# Patient Record
Sex: Female | Born: 1935 | Race: White | Hispanic: No | Marital: Married | Smoking: Current every day smoker
Health system: Southern US, Community
[De-identification: ages and names within clinical notes are randomized; demographics above are authoritative.]

## PROBLEM LIST (undated history)

## (undated) DIAGNOSIS — I714 Abdominal aortic aneurysm, without rupture, unspecified: Secondary | ICD-10-CM

## (undated) DIAGNOSIS — I701 Atherosclerosis of renal artery: Secondary | ICD-10-CM

## (undated) DIAGNOSIS — I10 Essential (primary) hypertension: Secondary | ICD-10-CM

## (undated) DIAGNOSIS — M1990 Unspecified osteoarthritis, unspecified site: Secondary | ICD-10-CM

## (undated) DIAGNOSIS — E785 Hyperlipidemia, unspecified: Secondary | ICD-10-CM

## (undated) DIAGNOSIS — I77810 Thoracic aortic ectasia: Secondary | ICD-10-CM

## (undated) DIAGNOSIS — J449 Chronic obstructive pulmonary disease, unspecified: Secondary | ICD-10-CM

## (undated) HISTORY — DX: Chronic obstructive pulmonary disease, unspecified: J44.9

## (undated) HISTORY — DX: Abdominal aortic aneurysm, without rupture, unspecified: I71.40

## (undated) HISTORY — DX: Unspecified osteoarthritis, unspecified site: M19.90

## (undated) HISTORY — DX: Abdominal aortic aneurysm, without rupture: I71.4

## (undated) HISTORY — DX: Thoracic aortic ectasia: I77.810

## (undated) HISTORY — PX: ABDOMINAL HYSTERECTOMY: SHX81

## (undated) HISTORY — DX: Hyperlipidemia, unspecified: E78.5

## (undated) HISTORY — PX: KIDNEY SURGERY: SHX687

## (undated) HISTORY — DX: Atherosclerosis of renal artery: I70.1

## (undated) HISTORY — DX: Essential (primary) hypertension: I10

---

## 2005-08-20 HISTORY — PX: ABDOMINAL AORTIC ANEURYSM REPAIR: SUR1152

## 2005-12-07 ENCOUNTER — Ambulatory Visit (HOSPITAL_COMMUNITY): Admission: RE | Admit: 2005-12-07 | Discharge: 2005-12-07 | Payer: Self-pay | Admitting: *Deleted

## 2006-01-07 ENCOUNTER — Encounter (INDEPENDENT_AMBULATORY_CARE_PROVIDER_SITE_OTHER): Payer: Self-pay | Admitting: Specialist

## 2006-01-07 ENCOUNTER — Inpatient Hospital Stay (HOSPITAL_COMMUNITY): Admission: RE | Admit: 2006-01-07 | Discharge: 2006-01-12 | Payer: Self-pay | Admitting: *Deleted

## 2006-10-24 ENCOUNTER — Ambulatory Visit: Payer: Self-pay | Admitting: *Deleted

## 2006-10-24 ENCOUNTER — Encounter: Admission: RE | Admit: 2006-10-24 | Discharge: 2006-10-24 | Payer: Self-pay | Admitting: *Deleted

## 2007-10-30 ENCOUNTER — Ambulatory Visit: Payer: Self-pay | Admitting: *Deleted

## 2007-10-30 ENCOUNTER — Encounter: Admission: RE | Admit: 2007-10-30 | Discharge: 2007-10-30 | Payer: Self-pay | Admitting: *Deleted

## 2008-10-21 ENCOUNTER — Encounter: Admission: RE | Admit: 2008-10-21 | Discharge: 2008-10-21 | Payer: Self-pay | Admitting: *Deleted

## 2008-10-21 ENCOUNTER — Ambulatory Visit: Payer: Self-pay | Admitting: *Deleted

## 2009-11-04 ENCOUNTER — Encounter: Admission: RE | Admit: 2009-11-04 | Discharge: 2009-11-04 | Payer: Self-pay | Admitting: Vascular Surgery

## 2009-11-04 ENCOUNTER — Ambulatory Visit: Payer: Self-pay | Admitting: Vascular Surgery

## 2010-01-10 ENCOUNTER — Ambulatory Visit: Payer: Self-pay | Admitting: Surgery

## 2010-01-10 ENCOUNTER — Ambulatory Visit (HOSPITAL_COMMUNITY): Admission: RE | Admit: 2010-01-10 | Discharge: 2010-01-10 | Payer: Self-pay | Admitting: Surgery

## 2010-01-10 HISTORY — PX: RENAL ARTERY STENT: SHX2321

## 2010-09-12 ENCOUNTER — Ambulatory Visit: Admit: 2010-09-12 | Payer: Self-pay | Admitting: Vascular Surgery

## 2010-09-12 ENCOUNTER — Ambulatory Visit
Admission: RE | Admit: 2010-09-12 | Discharge: 2010-09-12 | Payer: Self-pay | Source: Home / Self Care | Attending: Vascular Surgery | Admitting: Vascular Surgery

## 2010-09-13 NOTE — Assessment & Plan Note (Signed)
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  She also has a known thoracic ectasia and is status post resection and graft of abdominal aortic aneurysm. She reports that her renal function remains stable.  She did undergo a right renal artery angioplasty and stenting on Jan 10, 2010 for critical stenosis in the small kidney.  She reports no knowledge of any worsening renal function and had recent laboratory studies.  Her health is otherwise unchanged.  PHYSICAL EXAMINATION:  Blood pressure 127/69, pulse 71, respirations 18. She is no acute distress. HEENT:  Normal. CHEST:  Clear bilaterally. HEART:  Regular rate and rhythm. ABDOMEN:  Soft and nontender.  I do not hear any bruits. Her radial pulses are 2+. MUSCULOSKELETAL:  Shows no major deformity or cyanosis. NEUROLOGIC:  No focal weakness or paresthesias. SKIN:  Without ulcers or rashes.  She did undergo repeat renal duplex today and this shows continued shrinking in the size of her right kidney artery down to 6.5 cm.  The left kidney is 12.0 cm.  Her renal artery shows no evidence of restenosis.  I have recommend continued follow-up only with Tina Macdonald.  She does have a known 4.5 cm thoracic aneurysm and we will repeat her CT scan in 1 year and at that time reevaluate her renal artery as well.    Larina Earthly, M.D. Electronically Signed  TFE/MEDQ  D:  09/12/2010  T:  09/13/2010  Job:  [id number]

## 2010-09-22 NOTE — Procedures (Unsigned)
RENAL ARTERY DUPLEX EVALUATION  INDICATION:  Followup renal artery stent.  HISTORY: Diabetes:  No Cardiac:  No Hypertension:  Yes Smoking:  No  RENAL ARTERY DUPLEX FINDINGS: Aorta-Proximal:  39 cm/s Aorta-Mid:  37 cm/s Aorta-Distal:  30 cm/s Celiac Artery Origin:  136 cm/s SMA Origin:  42 cm/s                                   RIGHT               LEFT Renal Artery Origin:             NV cm/s             NV cm/s Renal Artery Proximal:           NV cm/s             NV cm/s Renal Artery Mid:                65 cm/s             92 cm/s Renal Artery Distal:             38 cm/s             113 cm/s Hilar Acceleration Time (AT):      m/s2                m/s2 Renal-Aortic Ratio (RAR):        1.67                2.9 Kidney Size:                     6.51 cm             12.0 cm End Diastolic Ratio (EDR): Resistive Index (RI):            0.73                0.65  IMPRESSION: 1. Right kidney appears atrophied; however, demonstrates normal     perfusion. 2. Left kidney appears within normal limits in regard to size. 3. No evidence of renal artery stenosis in visualized areas. 4. RI and RAR appear within normal limits bilaterally. 5. Limited visualization due to extensive bowel gas shadowing,     possibly due to patient cough.  ___________________________________________ Larina Earthly, M.D.  AS/MEDQ  D:  09/12/2010  T:  09/12/2010  Job:  [id number]

## 2010-10-04 ENCOUNTER — Other Ambulatory Visit: Payer: Self-pay | Admitting: Vascular Surgery

## 2010-10-04 DIAGNOSIS — R978 Other abnormal tumor markers: Secondary | ICD-10-CM

## 2010-10-31 ENCOUNTER — Ambulatory Visit
Admission: RE | Admit: 2010-10-31 | Discharge: 2010-10-31 | Disposition: A | Discharge status: Home / Self Care | Payer: Medicare Other | Source: Ambulatory Visit | Attending: Vascular Surgery | Admitting: Vascular Surgery

## 2010-10-31 ENCOUNTER — Other Ambulatory Visit: Payer: Self-pay

## 2010-10-31 ENCOUNTER — Ambulatory Visit (INDEPENDENT_AMBULATORY_CARE_PROVIDER_SITE_OTHER): Payer: Medicare Other | Admitting: Vascular Surgery

## 2010-10-31 DIAGNOSIS — I712 Thoracic aortic aneurysm, without rupture: Secondary | ICD-10-CM

## 2010-10-31 DIAGNOSIS — R978 Other abnormal tumor markers: Secondary | ICD-10-CM

## 2010-10-31 MED ORDER — IOHEXOL 350 MG/ML SOLN
100.0000 mL | Freq: Once | INTRAVENOUS | Status: AC | PRN
  Administered 2010-10-31: 100 mL via INTRAVENOUS

## 2010-10-31 NOTE — Assessment & Plan Note (Signed)
OFFICE VISIT  AHMANI, DAOUD DOB:  19-Jul-1928                                       10/31/2010 [id number]#:[id number]  Patient presents today for follow-up of her thoracic ectasia.  She is status post abdominal aortic aneurysm repair by Dr. Liliane Bade in 2007 with an open aneurysm repair.  At that time, her aneurysm was 5.3 cm in diameter.  She does have a known 3.5 to 4 cm ectasia throughout her thoracic aorta.  She had the last CT scan 1 year and is here today for follow-up of this.  She has done quite well recently with no new medical problems and walks without difficulty.  PHYSICAL EXAMINATION:  Blood pressure is 112/71, pulse 65, respiration 20.  HEENT:  Normal.  She is in no acute distress.  Her chest is clear bilaterally without rales, rhonchi or wheezes.  Her abdomen has normal pulsation, a well-healed midline incision, and normal 2+ femoral pulses bilaterally.  I reviewed her CT scan and discussed this patient.  This shows no change from her study 1 year ago.  I feel comfortable dropping back to every- other-year CT scan.  We will see her 1 year from now for renal duplex for follow-up of her renal angioplasty.    Larina Earthly, M.D. Electronically Signed  TFE/MEDQ  D:  10/31/2010  T:  10/31/2010  Job:  [id number]  cc:   Raynelle Jan, M.D.

## 2010-11-06 LAB — POCT I-STAT, CHEM 8
BUN: 15 mg/dL (ref 6–23)
Calcium, Ion: 1.06 mmol/L — ABNORMAL LOW (ref 1.12–1.32)
Chloride: 99 mEq/L (ref 96–112)
Creatinine, Ser: 0.9 mg/dL (ref 0.4–1.2)
Glucose, Bld: 89 mg/dL (ref 70–99)
Potassium: 4.4 mEq/L (ref 3.5–5.1)
Sodium: 134 mEq/L — ABNORMAL LOW (ref 135–145)

## 2011-01-02 NOTE — Assessment & Plan Note (Signed)
OFFICE VISIT   Tina Macdonald, Tina Macdonald  DOB:  04-08-1928                                       11/04/2009  ZOXWR#:[id number]   Patient presents today for continued follow-up of her known thoracic  aneurysm.  She is a former patient Dr. Liliane Bade and understands that  he has left our practice.  His most recent with her was 1 year ago.  At  that time, she had had a CT scan showing no change in her ectasia of her  thoracic aneurysm.  She is seen today for repeat CT scan at 1-year  interval.   She continues to be quite active.  She does have no major medical  difficulties currently.  She is stable from a cardiac standpoint.  Her  blood pressure and elevated cholesterol are controlled with medications.  She does not smoke, having quit in 1995.  Does not drink alcohol.   Her review of systems is reviewed and unchanged with her.   PHYSICAL EXAMINATION:  A well-developed, thin white female appearing her  stated age of 37.  Her radial pulses and femoral pulses and popliteal  pulses are 2+ bilaterally.  Heart:  Regular rate rhythm.  Chest is clear  bilaterally.  Abdominal exam reveals no masses or tenderness.  She has a  well-healed prior incision.  Blood pressure today is 133/78, heart rate  69, respirations 16.   She did undergo a CT scan of her chest and abdomen, and this reveals no  change in the ectasia of her thoracic aorta and no evidence of  difficulty with her prior open aneurysm repair.  She does have severe  right renal artery stenosis by CT angiogram.   On reviewing her old records, this had been considered prior to her  juxtarenal aneurysm repair by Dr. Madilyn Fireman.  Apparently, he felt  intraoperatively that there was no significant concern with this and did  not address this.  Her CT angiogram clearly shows a severe stenosis, and  she does have a smaller renal size.   I explained to the patient with her husband present that I would  recommend a formal  arteriography and probable stenting since she is  having some cortical thinning and a smaller size in her right versus  left renal artery.  She understands the indication of this and will be  scheduled as an outpatient at [facility] with Dr. Hermelinda Medicus, M.D.  Electronically Signed   TFE/MEDQ  D:  11/04/2009  T:  11/07/2009  Job:  [id number]   cc:   Raynelle Jan, M.D.  Jorge Ny, MD

## 2011-01-02 NOTE — Assessment & Plan Note (Signed)
OFFICE VISIT   Tina Macdonald, Tina Macdonald  DOB:  05/19/1928                                       10/30/2007  AOZHY#:[id number]   Patient has a history of an abdominal aortic aneurysm with juxtarenal  aneurysm repair carried out in 05/07.  She has a known small atrophic  right kidney.  She has also developed some ectasia of the descending  thoracic aorta and thoracoabdominal junction.   A CT scan was performed today, and initial measurements carried out by  me reveal no significant change in her descending thoracic aorta  measuring approximately 3.5 cm in maximal diameter and a similar  measurement in the suprarenal abdominal aorta.  This does not appear to  have changed significantly over the past year.   Patient does complain of some chronic back pain.  She has had no chest  pain or shortness of breath.   CURRENT MEDICATIONS:  1. Toprol XL 50 mg daily.  2. Vytorin 10/40 daily.  3. Hydrochlorothiazide 25 mg daily.   She has no known drug allergies.   Evaluation reveals the patient to be a 77 year old female who appears  her stated age.  She is alert and oriented in no acute distress.  Vitals  reveal a BP of 135/76, pulse 65 per minute, respirations 18 per minute,  O2 saturation 96%.  No carotid bruits are present.  Heart sounds are  normal without murmurs.  Chest:  Clear with equal entry bilaterally.  Abdomen is soft and nontender.  A well-healed midline incision.  No  masses or organomegaly.  Femoral pulses are 2+ bilaterally.  No ankle  edema.   Patient shows no significant change of her ectatic thoracic and  suprarenal aorta.  I will plan to follow up again in one year with a  repeat CT scan.   Balinda Quails, M.D.  Electronically Signed   PGH/MEDQ  D:  10/30/2007  T:  10/31/2007  Job:  [id number]   cc:   Raynelle Jan, M.D.

## 2011-01-02 NOTE — Assessment & Plan Note (Signed)
OFFICE VISIT   LAJUAN, GODBEE  DOB:  12-Nov-1927                                       10/21/2008  HQION#:[id number]   The patient underwent abdominal aortic aneurysm repair in May of 2007.  She has a known descending thoracic aortic and suprarenal small  aneurysm.   CT was performed today.  Aneurysm remains stable in size with the  diaphragm measuring 3.8 cm in maximal diameter.  This has not changed  significantly over the past year.   She has complaints of chronic back pain.  No recent chest pain or  shortness of breath.   CURRENT MEDICATIONS:  1. Include Toprol XL 50 mg daily.  2. Hydrochlorothiazide 25 mg daily.  3. Zocor 80 mg daily.  4. Niaspan 250 mg daily.   ALLERGIES:  She has no known drug allergies.   PHYSICAL EXAMINATION:  General:  Evaluation reveals a well-appearing 77-  year-old female.  No distress.  Vital signs:  BP 114/72, pulse 80 per  minute.  Neck:  She had no carotid bruits.  Chest:  Is clear with equal  entry bilaterally.  Normal heart sounds without murmurs.  Abdomen:  Soft, nontender.  No masses or organomegaly.  Intact femoral pulses  bilaterally.   The patient has a small descending thoracic and suprarenal aortic  aneurysm.  Stable in size over the past year.  Plan followup again in 1  year with repeat CT scan.   Balinda Quails, M.D.  Electronically Signed   PGH/MEDQ  D:  10/21/2008  T:  10/22/2008  Job:  [id number]   cc:   Raynelle Jan, M.D.

## 2011-01-05 NOTE — Op Note (Signed)
Tina Macdonald, Tina Macdonald            ACCOUNT NO.:  [id number]   MEDICAL RECORD NO.:  [id number]          PATIENT TYPE:  AMB   LOCATION:  SDS                          FACILITY:  [facility]   PHYSICIAN:  Balinda Quails, M.D.    DATE OF BIRTH:  March 23, 1928   DATE OF PROCEDURE:  12/07/2005  DATE OF DISCHARGE:                                 OPERATIVE REPORT   DIAGNOSIS:  5.3 cm abdominal aortic aneurysm.   PROCEDURE:  1.  Abdominal aortogram with pelvic runoff arteriography.  2.  Retrograde right femoral arteriogram.  3.  Starclose right common femoral artery.   ACCESS:  Right common femoral artery 5-French sheath.   CONTRAST:  165 grams mL Visipaque.   COMPLICATIONS:  None apparent.   CLINICAL NOTE:  Tina Macdonald is a 77 year old female followed in the  office with a known abdominal aortic aneurysm. This was a bilobed juxtarenal  abdominal aortic aneurysm. She recently presented to the office with  enlargement of the aneurysm now measuring 5.3 cm in maximal diameter. She is  brought to the cath lab at this time for diagnostic workup with an  aortogram.   PROCEDURE NOTE:  The patient was brought to the cath lab in stable  condition. Placed in supine position. Both groins prepped and draped in  sterile fashion. He received 1 mg of Versed and 1 mg of Fentanyl  intravenously.  The skin and subcutaneous tissue of the right groin was  instilled with 1% Xylocaine. The needle was easily introduced in the right  common femoral artery. 0.035 J-wire passed through the needle into the mid  abdominal aorta under fluoroscopy. The needle was removed, 5-French sheath  advanced over the guidewire, dilator removed, sheath flushed with heparin  saline solution.   A standard pigtail catheter was then advanced over the guidewire. The  guidewire was advanced up to the level of the diaphragm. Pigtail catheter  advanced to the level the diaphragm. AP abdominal aortogram obtained. This  revealed ectasia  of the suprarenal aorta. There was tapering at the level of  the mesenterics and renal artery takeoff. There was marked tortuosity of the  infrarenal aorta with an extremely tortuous neck. A juxtarenal aneurysm was  present. The infrarenal aorta revealed a bilobed abdominal aortic aneurysm  with laminated thrombus within the sac.   A lateral aortogram was obtained. 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   The pigtail catheter was then brought down to the juxtarenal aorta.  Abdominal aortogram obtained in the AP projection revealed the right renal  artery to be tented and a high-grade proximal stenosis. A large left renal  artery present which appeared widely patent. Possible inferior pole right  renal artery present also. The aneurysm sac was bilobed with laminated  thrombus present. Calcification of the common iliac artery evident  proximally. Common iliac arteries were widely patent bilaterally. Pelvic  arteriography obtained in AP and bilateral oblique projections. This  revealed widely patent common iliac, hypogastric and external iliac  arteries. Normal flow to the common femoral level bilaterally.  A retrograde right femoral arteriogram then obtained. This revealed the  access to be in the common femoral artery. A guidewire was inserted and the  right femoral sheath removed. A Starclose sheath was advanced over the  guidewire and the dilator removed. Starclose device then advanced to the  sheath and clicked into position. The device loaded and was withdrawn back  to the arterial wall and deployed. No apparent complications.  The patient  tolerated procedure well. Transferred recovery in stable condition.   FINAL IMPRESSION:  1.  Juxtarenal bilobed abdominal aortic aneurysm 5.3 cm maximal diameter.  2.  High-grade right renal artery stenosis.  3.  Moderate calcific plaque common iliac arteries bilaterally.    DISPOSITION:  These results been reviewed with the patient and her husband.  A Cardiolite stress test will be obtained. The patient will require open  operative repair.      Balinda Quails, M.D.  Electronically Signed     PGH/MEDQ  D:  12/07/2005  T:  12/07/2005  Job:  [id number]   cc:   Raynelle Jan, M.D.  Fax: [phone]

## 2011-01-05 NOTE — Op Note (Signed)
NAMEKAMEE, BOBST            ACCOUNT NO.:  [id number]   MEDICAL RECORD NO.:  [id number]          PATIENT TYPE:  INP   LOCATION:  2014                         FACILITY:  [facility]   PHYSICIAN:  Balinda Quails, M.D.    DATE OF BIRTH:  04-Sep-1927   DATE OF PROCEDURE:  01/11/2006  DATE OF DISCHARGE:  01/12/2006                                 OPERATIVE REPORT   SURGEON:  Denman George, M.D.   ASSISTANTS:  1.  Fabienne Bruns, M.D.  2.  Zadie Rhine, P.A.   ANESTHETIC:  General endotracheal.   ANESTHESIOLOGIST:  Dr. Jean Rosenthal.   PREOPERATIVE DIAGNOSIS:  Abdominal aortic aneurysm, 5.3 cm.   POSTOPERATIVE DIAGNOSIS:  Abdominal aortic aneurysm, 5.3 cm.   PROCEDURE:  Repair of 5.3 cm abdominal aortic aneurysm with 18 mm straight  aortic graft.   CLINICAL NOTE:  Tina Macdonald is a 77 year old female with a known  abdominal aortic aneurysm.  She is followed in the office.  She does also  have some ectasia of the thoracic and suprarenal aorta.  Main aneurysmal  dilatation is noted to be in the infrarenal aorta.  This was dilated to 5.3  cm maximal diameter.  There is an neck of the aneurysm in the juxtarenal  segment.  She is brought to the operating at this time for operative repair.   OPERATIVE PROCEDURE:  The patient is brought to the operating room in stable  hemodynamic condition, placed under general endotracheal anesthesia.  Foley  catheter, arterial lines and Swan-Ganz catheter were in placed.  The abdomen  and both legs prepped and draped in a sterile fashion.   Midline skin incision made from xiphoid to pubis.  Incision extended deep  through the subcutaneous tissue.  Electrocautery used to divide subcutaneous  tissue down to the linea alba.  The fascia was incised.  The peritoneal  cavity entered.  There were adhesions of the pelvis from previous  hysterectomy and bilateral salpingo-oophorectomy.  These were divided,  mobilizing the omentum.   Full laparotomy  evaluation carried out.  Stomach and duodenum were  unremarkable.  Liver, gallbladder, bile ducts pancreas normal.  Small-bowel  unremarkable.  Large bowel revealed no masses.   The transverse colon was brought superiorly and the small bowel retracted  the right.  The retroperitoneum incised over the aneurysm sac and mobilized  proximally up to the renal arteries.  Left renal vein was skeletonized and  retracted superiorly.  The origin of the renal arteries bilaterally was  dissected out and encircled with vessel loops.  The aneurysm was dilated up  to the renal arteries where there was a short neck.  The suprarenal aorta  was cleared for clamp placement.   Distal dissection was then carried down along the aneurysm sac.  The origin  of the inferior mesenteric artery was encircled with the vessel loop.  Further distal dissection carried down to the origin of the common iliac  arteries bilaterally.  These were relatively normal in size and were freed  and encircled with vessel loops.   The patient was administered 6000 units heparin intravenously, 50 g of  mannitol.  The  suprarenal aorta controlled with the aortic DeBakey clamp.  The common iliac arteries bilaterally were controlled with coarctation  clamps.  The aneurysm sac opened longitudinally with electrocautery.  Backbleeding from the inferior mesenteric artery was noted, and this was  ligated with 2-0 silk.  Backbleeding lumbar vessels were controlled with  figure-of-eight 2-0 silk suture.  The aneurysm opened up to the juxtarenal  segment.  There was a short neck right at the takeoff of the right renal  artery.  An 18-mm straight aortic graft was anastomosed to the juxtarenal  aorta using running 3-0 Prolene suture.  At completion of the proximal  anastomosis, the graft was flushed.  Renal arteries were flushed and clamps  were removed reperfusing the kidneys.   By arteriography there was noted a right renal artery stenosis.   However,  with mobilization of the renal artery and evaluation this did not appear to  be severe.   The graft was then brought down to the aortic bifurcation.  The aneurysm sac  opened up to the bifurcation.  The straight graft was then anastomosed end-  to-end to the aortic bifurcation using running 3-0 Prolene suture.  At  completion of the distal anastomosis, the graft was flushed.  The iliac  vessels back flushed.  Clamps were removed.  Excellent flow present.  Both  feet well perfused.   The patient was administered 50 mg protamine intravenously.  Adequate  hemostasis obtained.  Sponge and instrument counts correct.   The sac to the aneurysm sac was closed over the graft using running 2-0  Vicryl suture.  The retroperitoneum was reapproximated with running 3-0  Vicryl suture.   The abdomen was then examined to assure there were no retained instruments  or sponges.   The midline fascia was closed with running #1 PDS suture.  Staples applied  to skin.  Sterile dressings applied.   There no apparent complications.  The patient was transferred directly to  surgical intensive care unit in stable condition.      Balinda Quails, M.D.  Electronically Signed     PGH/MEDQ  D:  01/11/2006  T:  01/12/2006  Job:  [id number]   cc:   Raynelle Jan, M.D.  Fax: [phone]

## 2011-01-05 NOTE — Discharge Summary (Signed)
Tina Macdonald, Tina Macdonald            ACCOUNT NO.:  [id number]   MEDICAL RECORD NO.:  [id number]          PATIENT TYPE:  INP   LOCATION:  2014                         FACILITY:  [facility]   PHYSICIAN:  Balinda Quails, M.D.    DATE OF BIRTH:  22-Dec-1927   DATE OF ADMISSION:  01/07/2006  DATE OF DISCHARGE:  01/12/2006                                 DISCHARGE SUMMARY   ADMISSION DIAGNOSIS:  Abdominal aortic aneurysm.   DISCHARGE DIAGNOSES:  1.  Abdominal aortic aneurysm, status post abdominal aortic aneurysm repair.  2.  Hypertension.  3.  Hypercholesterolemia.  4.  Right renal artery stenosis.   CONSULTATIONS:  None.   PROCEDURE:  Patient underwent an abdominal aortic aneurysm repair on Jan 07, 2006 by Dr. Liliane Bade.   HISTORY AND PHYSICAL:  The patient is a 77 year old white female who has  been followed by Dr. Madilyn Fireman since 2005, at which time she was found to have a  4.5 cm abdominal aortic aneurysm.  Most recently, studies revealed that the  aneurysm is now 5.2 cm in maximum diameter.  The aneurysm is a direct renal  aneurysm on the arteriogram.  The following was noted:  Juxtarenal bilobed  abdominal aortic aneurysm 5.3 cm in maximum diameter, high grade right renal  stenosis, moderate calcific plaque in the common iliac arteries bilaterally.  Due to these findings, it was recommended by Dr. Madilyn Fireman to proceed with  elective surgical revascularization to include resection and grafting of the  aneurysm as well as right renal artery bypass.  Patient underwent cardiology  evaluation, including Cardiolite study, which is negative for ischemia and  showed an ejection fraction of only 80%.  The patient has only mild  occasional abdominal pain.  She additionally has some mild occasional reflux  symptoms but denies any nausea, vomiting, constipation, hematochezia,  hematemesis, back pain, claudication, peripheral edema, dysuria, angina,  palpitations, TIA/CVA symptoms.  The patient was  admitted electively on Jan 07, 2006 at [facility] for this procedure.   HOSPITAL COURSE:  Postoperatively, the patient progressed very well.  On  postop day #1, the patient's vital signs were stable.  She was extubated.  She was alert and oriented.  The patient was hemodynamically stable.  She  was out of bed to a chair.   On Jan 09, 2006, the patient remained stable and was transferred to 2000  appropriately.  The patient did have a slight rise in her CBGs.  She was  started on a NovoLog-sensitive scale every four hours.   On postop day #3, the patient was ambulating in the halls.  Her vitals again  remained stable.  She remained hemodynamically stable.  The patient was  started on a clear-liquid diet, which she tolerated well.  The patient's  diet was increased to regular on Jan 11, 2006.  The patient's CBGs have been  monitored closely and only slightly elevated.   The patient will be discharged home in the morning as long as she is  tolerating her diet well.  She continues to ambulate and continues her  incentive spirometry.  The  patient did have a positive bowel movement on Jan 10, 2006.   PHYSICAL EXAMINATION:  VITALS:  Blood pressure 137/79, heart rate 76,  respirations 18, temperature 98.6.  Oxygen saturation 93% on room air.  I's  and O's, -200.  CBGs 86/130.  CARDIAC:  Regular rate and rhythm.  RESPIRATIONS:  Clear to auscultation bilaterally.  ABDOMEN:  Bowel sounds positive x4.  Normoactive.  Soft, nontender,  nondistended.  Abdominal incision, clear, dry, intact.  EXTREMITIES:  No edema.  Positive dorsalis pedis pulses bilaterally.   DISCHARGE CONDITION:  Good.   DISPOSITION:  Patient will be discharged to home.   MEDICATIONS:  1.  Tylox 1-2 tablets every 4 hours p.r.n.  2.  Toprol XL 50 mg p.o. daily.  3.  Pepcid 20 mg p.o. daily.  4.  Vytorin 10/40 mg p.o. daily.  5.  Hydrochlorothiazide 25 mg p.o. daily.   INSTRUCTIONS:  Patient is instructed to  follow a low fat, low salt diet.  She is to do no driving or heavy lifting greater than 10 pounds per three  weeks.  Patient is to ambulate 3-4 times daily and increase her activity as  tolerated.  The patient may shower and clean her wound with mild soap and  water.  She is to continue her breathing exercises.  Patient was instructed  to call the office if any problems such as wound erythema, drainage,  temperature greater than 101.5.   FOLLOW UP:  Patient has a follow-up appointment with Dr. Madilyn Fireman on February 07, 2006 at 2:50 p.m.  Patient will have a staple removal appointment on January 25, 2006 at 9:30 a.m.      Tina Holster, PA      P. Liliane Bade, M.D.  Electronically Signed    JMW/MEDQ  D:  01/11/2006  T:  01/11/2006  Job:  [id number]
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
## 2011-06-18 ENCOUNTER — Other Ambulatory Visit: Payer: Self-pay | Admitting: *Deleted

## 2011-06-18 DIAGNOSIS — I712 Thoracic aortic aneurysm, without rupture: Secondary | ICD-10-CM

## 2011-09-06 ENCOUNTER — Encounter: Payer: Self-pay | Admitting: Vascular Surgery

## 2011-09-11 ENCOUNTER — Other Ambulatory Visit: Payer: Medicare Other

## 2011-09-11 ENCOUNTER — Ambulatory Visit: Payer: Medicare Other | Admitting: Vascular Surgery

## 2011-09-11 ENCOUNTER — Other Ambulatory Visit: Payer: Self-pay | Admitting: Vascular Surgery

## 2011-09-12 LAB — BUN: BUN: 16 mg/dL (ref 6–23)

## 2011-09-12 LAB — CREATININE, SERUM: Creat: 0.8 mg/dL (ref 0.50–1.10)

## 2011-09-17 ENCOUNTER — Encounter: Payer: Self-pay | Admitting: Vascular Surgery

## 2011-09-18 ENCOUNTER — Ambulatory Visit
Admission: RE | Admit: 2011-09-18 | Discharge: 2011-09-18 | Disposition: A | Discharge status: Home / Self Care | Payer: Medicare Other | Source: Ambulatory Visit | Attending: Vascular Surgery | Admitting: Vascular Surgery

## 2011-09-18 ENCOUNTER — Encounter: Payer: Self-pay | Admitting: Vascular Surgery

## 2011-09-18 ENCOUNTER — Ambulatory Visit (INDEPENDENT_AMBULATORY_CARE_PROVIDER_SITE_OTHER): Payer: Medicare Other | Admitting: Vascular Surgery

## 2011-09-18 VITALS — BP 112/68 | HR 62 | Resp 18 | Ht 61.0 in | Wt 98.0 lb

## 2011-09-18 DIAGNOSIS — I712 Thoracic aortic aneurysm, without rupture, unspecified: Secondary | ICD-10-CM | POA: Insufficient documentation

## 2011-09-18 MED ORDER — IOHEXOL 350 MG/ML SOLN
100.0000 mL | Freq: Once | INTRAVENOUS | Status: AC | PRN
  Administered 2011-09-18: 100 mL via INTRAVENOUS
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
## 2011-10-31 ENCOUNTER — Other Ambulatory Visit: Payer: Medicare Other

## 2011-11-08 ENCOUNTER — Ambulatory Visit (INDEPENDENT_AMBULATORY_CARE_PROVIDER_SITE_OTHER): Payer: Medicare Other | Admitting: Vascular Surgery

## 2011-11-08 DIAGNOSIS — I722 Aneurysm of renal artery: Secondary | ICD-10-CM

## 2011-11-08 DIAGNOSIS — Z48812 Encounter for surgical aftercare following surgery on the circulatory system: Secondary | ICD-10-CM

## 2011-11-13 NOTE — Procedures (Unsigned)
RENAL ARTERY DUPLEX EVALUATION  INDICATION:  Renal artery aneurysm and right renal artery stent placed on 01/10/2010.  HISTORY: Diabetes:  No Cardiac:  No Hypertension:  Yes Smoking:  Previous  RENAL ARTERY DUPLEX FINDINGS: Aorta-Proximal:  51 cm/s Aorta-Mid:  49 cm/s Aorta-Distal:  26 cm/s Celiac Artery Origin:  168 cm/s SMA Origin:  144 cm/s                                   RIGHT               LEFT Renal Artery Origin:             158 cm/s            58 cm/s Renal Artery Proximal:           96 cm/s             159 cm/s Renal Artery Mid:                99 cm/s             121 cm/s Renal Artery Distal:             140 cm/s            144 cm/s Hilar Acceleration Time (AT): Renal-Aortic Ratio (RAR):        3.10                3.12 Kidney Size:                     10.2 cm             11.4 cm End Diastolic Ratio (EDR): Resistive Index (RI):  IMPRESSION: 1. Technically difficult evaluation due to small ribs spaces and     limited visualization of the kidneys. 2. Right renal artery stent appears patent with elevated velocities     present. 3. Right renal aortic ratio is less than 3.5, indicative of less than     60% stenosis. 4. Left renal artery is patent with ratio less than 3.5, indicative of     less than 60% stenosis. 5. Patent bilateral renal veins. 6. Bilateral renal artery tortuosity is present.  ___________________________________________ Larina Earthly, M.D.  SH/MEDQ  D:  11/08/2011  T:  11/08/2011  Job:  [id number]

## 2012-06-13 ENCOUNTER — Other Ambulatory Visit: Payer: Self-pay | Admitting: *Deleted

## 2012-06-13 DIAGNOSIS — I712 Thoracic aortic aneurysm, without rupture: Secondary | ICD-10-CM

## 2012-06-13 DIAGNOSIS — R911 Solitary pulmonary nodule: Secondary | ICD-10-CM

## 2012-06-13 DIAGNOSIS — I701 Atherosclerosis of renal artery: Secondary | ICD-10-CM

## 2012-09-09 ENCOUNTER — Other Ambulatory Visit: Payer: Self-pay | Admitting: Vascular Surgery

## 2012-09-09 LAB — CREATININE, SERUM: Creat: 1.03 mg/dL (ref 0.50–1.10)

## 2012-09-15 ENCOUNTER — Encounter: Payer: Self-pay | Admitting: Vascular Surgery

## 2012-09-16 ENCOUNTER — Ambulatory Visit
Admission: RE | Admit: 2012-09-16 | Discharge: 2012-09-16 | Disposition: A | Discharge status: Home / Self Care | Payer: Medicare Other | Source: Ambulatory Visit | Attending: Vascular Surgery | Admitting: Vascular Surgery

## 2012-09-16 ENCOUNTER — Ambulatory Visit (INDEPENDENT_AMBULATORY_CARE_PROVIDER_SITE_OTHER): Payer: Medicare Other | Admitting: Vascular Surgery

## 2012-09-16 ENCOUNTER — Encounter: Payer: Self-pay | Admitting: Vascular Surgery

## 2012-09-16 VITALS — BP 128/73 | HR 60 | Resp 18 | Ht 61.0 in | Wt 106.6 lb

## 2012-09-16 DIAGNOSIS — I722 Aneurysm of renal artery: Secondary | ICD-10-CM

## 2012-09-16 DIAGNOSIS — R911 Solitary pulmonary nodule: Secondary | ICD-10-CM

## 2012-09-16 DIAGNOSIS — I712 Thoracic aortic aneurysm, without rupture: Secondary | ICD-10-CM

## 2012-09-16 DIAGNOSIS — I701 Atherosclerosis of renal artery: Secondary | ICD-10-CM

## 2012-09-16 MED ORDER — IOHEXOL 350 MG/ML SOLN
75.0000 mL | Freq: Once | INTRAVENOUS | Status: AC | PRN
  Administered 2012-09-16: 75 mL via INTRAVENOUS

## 2012-09-16 NOTE — Progress Notes (Signed)
Renal artery duplex performed @ [facility] 09/16/2012

## 2012-09-16 NOTE — Progress Notes (Signed)
The patient has today for followup of her thoracic aneurysm and right renal artery stent placed in may of 2011. She continues to be quite active at her age of 80 with no major health issues. She denies any difficulty with her renal function. She does not have accelerated hypertension. She is status post infrarenal aortic aneurysm repair in 2007.  Past Medical History  Diagnosis Date  . Hypertension   . Hyperlipidemia   . Renal artery stenosis   . AAA (abdominal aortic aneurysm)   . COPD (chronic obstructive pulmonary disease)   . Arthritis   . Thoracic aortic ectasia     History  Substance Use Topics  . Smoking status: Former Smoker -- 25 years    Types: Cigarettes    Quit date: 08/20/1992  . Smokeless tobacco: Never Used  . Alcohol Use: No    No family history on file.  No Known Allergies  Current outpatient prescriptions:aspirin 81 MG tablet, Take 160 mg by mouth daily., Disp: , Rfl: ;  clopidogrel (PLAVIX) 75 MG tablet, Take 75 mg by mouth daily., Disp: , Rfl: ;  co-enzyme Q-10 30 MG capsule, Take 30 mg by mouth daily., Disp: , Rfl: ;  hydrochlorothiazide (HYDRODIURIL) 25 MG tablet, Take 25 mg by mouth daily., Disp: , Rfl: ;  Ibuprofen-Diphenhydramine Cit (ADVIL PM PO), Take by mouth., Disp: , Rfl:  metoprolol succinate (TOPROL-XL) 50 MG 24 hr tablet, Take 50 mg by mouth daily. Take with or immediately following a meal., Disp: , Rfl: ;  niacin (NIASPAN) 500 MG CR tablet, Take 500 mg by mouth at bedtime., Disp: , Rfl: ;  simvastatin (ZOCOR) 80 MG tablet, Take 80 mg by mouth at bedtime., Disp: , Rfl:   BP 128/73  Pulse 60  Resp 18  Ht 5\' 1"  (1.549 m)  Wt 106 lb 9.6 oz (48.353 kg)  BMI 20.14 kg/m2  Body mass index is 20.14 kg/(m^2).       Physical exam well-developed small white female in no acute distress Carotid arteries without bruits bilaterally Radial and femoral pulses 2+ bilaterally Abdomen soft nontender no masses noted well-healed midline  incision Neurologically she is grossly intact  CT scan today ever at chest reveals ectasia of her thoracic aorta with maximal diameter of 3.8 cm which is minimally change since her study from one year ago. This does show patency of her right renal stent  Duplex is a difficult due to overlying bowel gas. Her renal size is small on the right at 8 cm. We have had study showing smallest 6-1/2 cm on the right. Normal kidney size in the left 11 cm.  Impression and plan: A slight interval increase in size of thoracic aorta. Patent right renal stent with a small kidney. I discussed this with the patient. Recommend that we see her in 2 years with repeat CT angiogram. I would not follow with the duplex on her right kidney since the kidney size is small we would not recommend intervening. He understands the symptoms of leaking aneurysm and is to report immediately to emerge from should this occur

## 2012-09-17 ENCOUNTER — Other Ambulatory Visit: Payer: Self-pay | Admitting: *Deleted

## 2012-09-17 DIAGNOSIS — I714 Abdominal aortic aneurysm, without rupture: Secondary | ICD-10-CM

## 2012-09-17 DIAGNOSIS — Z48812 Encounter for surgical aftercare following surgery on the circulatory system: Secondary | ICD-10-CM

## 2014-09-13 ENCOUNTER — Other Ambulatory Visit: Payer: Self-pay | Admitting: Vascular Surgery

## 2014-09-13 LAB — CREATININE, SERUM: CREATININE: 0.83 mg/dL (ref 0.50–1.10)

## 2014-09-13 LAB — BUN: BUN: 15 mg/dL (ref 6–23)

## 2014-09-20 ENCOUNTER — Other Ambulatory Visit: Payer: Self-pay | Admitting: *Deleted

## 2014-09-20 ENCOUNTER — Encounter: Payer: Self-pay | Admitting: Vascular Surgery

## 2014-09-20 DIAGNOSIS — I714 Abdominal aortic aneurysm, without rupture, unspecified: Secondary | ICD-10-CM

## 2014-09-20 DIAGNOSIS — I716 Thoracoabdominal aortic aneurysm, without rupture, unspecified: Secondary | ICD-10-CM

## 2014-09-20 DIAGNOSIS — Z48812 Encounter for surgical aftercare following surgery on the circulatory system: Secondary | ICD-10-CM

## 2014-09-21 ENCOUNTER — Ambulatory Visit
Admission: RE | Admit: 2014-09-21 | Discharge: 2014-09-21 | Disposition: A | Discharge status: Home / Self Care | Payer: Medicare HMO | Source: Ambulatory Visit | Attending: Vascular Surgery | Admitting: Vascular Surgery

## 2014-09-21 ENCOUNTER — Ambulatory Visit (INDEPENDENT_AMBULATORY_CARE_PROVIDER_SITE_OTHER): Payer: Medicare HMO | Admitting: Vascular Surgery

## 2014-09-21 VITALS — BP 108/61 | HR 65 | Temp 97.4°F | Resp 16 | Ht 61.0 in | Wt 94.4 lb

## 2014-09-21 DIAGNOSIS — I712 Thoracic aortic aneurysm, without rupture, unspecified: Secondary | ICD-10-CM

## 2014-09-21 MED ORDER — IOHEXOL 350 MG/ML SOLN
75.0000 mL | Freq: Once | INTRAVENOUS | Status: AC | PRN
  Administered 2014-09-21: 75 mL via INTRAVENOUS

## 2014-09-21 NOTE — Progress Notes (Signed)
Patient presents today for followup of multiple vascular issues. She is status post open aneurysm repair by Dr. Amedeo Plenty in 2007. She also is status post right renal artery stent. She does have known ectasia of her thoracic or aorta and is seen today with CT scan of chest abdomen and pelvis for evaluation of these issues. She denies any new major medical difficulties. She is here today with her husband. She does not have any recent cardiac issues.  Past Medical History  Diagnosis Date  . Hypertension   . Hyperlipidemia   . Renal artery stenosis   . AAA (abdominal aortic aneurysm)   . COPD (chronic obstructive pulmonary disease)   . Arthritis   . Thoracic aortic ectasia     History  Substance Use Topics  . Smoking status: Former Smoker -- 25 years    Types: Cigarettes    Quit date: 08/20/1992  . Smokeless tobacco: Never Used  . Alcohol Use: No    No family history on file.  Allergies  Allergen Reactions  . Niacin And Related Itching  . Prednisone Palpitations    Nervousness      Current outpatient prescriptions:  .  clopidogrel (PLAVIX) 75 MG tablet, Take 75 mg by mouth daily., Disp: , Rfl:  .  co-enzyme Q-10 30 MG capsule, Take 30 mg by mouth daily., Disp: , Rfl:  .  hydrochlorothiazide (HYDRODIURIL) 25 MG tablet, Take 25 mg by mouth daily., Disp: , Rfl:  .  Ibuprofen-Diphenhydramine Cit (ADVIL PM PO), Take by mouth., Disp: , Rfl:  .  metoprolol succinate (TOPROL-XL) 50 MG 24 hr tablet, Take 50 mg by mouth daily. Take with or immediately following a meal., Disp: , Rfl:  .  simvastatin (ZOCOR) 80 MG tablet, Take 80 mg by mouth at bedtime., Disp: , Rfl:  .  aspirin 81 MG tablet, Take 160 mg by mouth daily., Disp: , Rfl:  .  niacin (NIASPAN) 500 MG CR tablet, Take 500 mg by mouth at bedtime., Disp: , Rfl:   BP 108/61 mmHg  Pulse 65  Temp(Src) 97.4 F (36.3 C) (Oral)  Resp 16  Ht 5\' 1"  (1.549 m)  Wt 94 lb 6.4 oz (42.82 kg)  BMI 17.85 kg/m2  SpO2 98%  Body mass index is  17.85 kg/(m^2).       Physical exam she is no white female no acute distress Grossly intact neurologically 2+ radial and 2 posterolateral pulses bilaterally Abdomen soft nontender: No masses noted Carotid arteries without bruits bilaterally  Her leisure CT scan. This does show some increasing ectasia of the thoracic aorta up to maximal diameter 4.2 cm. She does have flow through her right renal artery stent with a small atretic right renal artery. She does have extensive mural thrombus at the level of her mesenteric and renal arteries.  Impression and plan stable vascular status. Discussed all these issues with the patient. Explained the slight risk for embolus from her mural thrombus but certainly would not recommend any surgery to treat this. I did explain that she has shown slow regression in size of her thoracic aorta but certainly would not recommend any treatment at this time. Did explain symptoms of ruptured thoracic aneurysm she does report immediately to the merchant should this occur. Filter risk for repair would far outweigh any risk for rupture of small thoracic aneurysm. We will see her again in 2 years with repeat CT scan

## 2014-09-22 ENCOUNTER — Other Ambulatory Visit: Payer: Self-pay | Admitting: *Deleted

## 2014-09-22 DIAGNOSIS — I712 Thoracic aortic aneurysm, without rupture, unspecified: Secondary | ICD-10-CM

## 2014-09-22 DIAGNOSIS — I722 Aneurysm of renal artery: Secondary | ICD-10-CM

## 2015-07-21 DIAGNOSIS — J441 Chronic obstructive pulmonary disease with (acute) exacerbation: Secondary | ICD-10-CM | POA: Diagnosis not present

## 2015-07-21 DIAGNOSIS — K591 Functional diarrhea: Secondary | ICD-10-CM | POA: Diagnosis not present

## 2015-07-21 DIAGNOSIS — R634 Abnormal weight loss: Secondary | ICD-10-CM | POA: Diagnosis not present

## 2015-07-21 DIAGNOSIS — J208 Acute bronchitis due to other specified organisms: Secondary | ICD-10-CM | POA: Diagnosis not present

## 2015-08-04 DIAGNOSIS — J208 Acute bronchitis due to other specified organisms: Secondary | ICD-10-CM | POA: Diagnosis not present

## 2015-08-04 DIAGNOSIS — R636 Underweight: Secondary | ICD-10-CM | POA: Diagnosis not present

## 2015-08-04 DIAGNOSIS — Z681 Body mass index (BMI) 19 or less, adult: Secondary | ICD-10-CM | POA: Diagnosis not present

## 2015-08-04 DIAGNOSIS — K591 Functional diarrhea: Secondary | ICD-10-CM | POA: Diagnosis not present

## 2015-08-18 DIAGNOSIS — K591 Functional diarrhea: Secondary | ICD-10-CM | POA: Diagnosis not present

## 2015-08-18 DIAGNOSIS — J441 Chronic obstructive pulmonary disease with (acute) exacerbation: Secondary | ICD-10-CM | POA: Diagnosis not present

## 2015-09-02 DIAGNOSIS — Z23 Encounter for immunization: Secondary | ICD-10-CM | POA: Diagnosis not present

## 2015-09-13 DIAGNOSIS — J449 Chronic obstructive pulmonary disease, unspecified: Secondary | ICD-10-CM | POA: Diagnosis not present

## 2015-09-13 DIAGNOSIS — I10 Essential (primary) hypertension: Secondary | ICD-10-CM | POA: Diagnosis not present

## 2015-09-13 DIAGNOSIS — R634 Abnormal weight loss: Secondary | ICD-10-CM | POA: Diagnosis not present

## 2015-09-13 DIAGNOSIS — Z681 Body mass index (BMI) 19 or less, adult: Secondary | ICD-10-CM | POA: Diagnosis not present

## 2015-09-13 DIAGNOSIS — K591 Functional diarrhea: Secondary | ICD-10-CM | POA: Diagnosis not present

## 2015-09-13 DIAGNOSIS — E785 Hyperlipidemia, unspecified: Secondary | ICD-10-CM | POA: Diagnosis not present

## 2015-09-21 DIAGNOSIS — E7800 Pure hypercholesterolemia, unspecified: Secondary | ICD-10-CM | POA: Diagnosis not present

## 2015-09-21 DIAGNOSIS — I10 Essential (primary) hypertension: Secondary | ICD-10-CM | POA: Diagnosis not present

## 2015-10-18 DIAGNOSIS — J019 Acute sinusitis, unspecified: Secondary | ICD-10-CM | POA: Diagnosis not present

## 2015-10-18 DIAGNOSIS — Z681 Body mass index (BMI) 19 or less, adult: Secondary | ICD-10-CM | POA: Diagnosis not present

## 2015-10-24 DIAGNOSIS — Z681 Body mass index (BMI) 19 or less, adult: Secondary | ICD-10-CM | POA: Diagnosis not present

## 2015-10-24 DIAGNOSIS — J441 Chronic obstructive pulmonary disease with (acute) exacerbation: Secondary | ICD-10-CM | POA: Diagnosis not present

## 2015-10-24 DIAGNOSIS — J208 Acute bronchitis due to other specified organisms: Secondary | ICD-10-CM | POA: Diagnosis not present

## 2015-11-23 DIAGNOSIS — L570 Actinic keratosis: Secondary | ICD-10-CM | POA: Diagnosis not present

## 2015-11-29 DIAGNOSIS — J208 Acute bronchitis due to other specified organisms: Secondary | ICD-10-CM | POA: Diagnosis not present

## 2015-11-29 DIAGNOSIS — Z681 Body mass index (BMI) 19 or less, adult: Secondary | ICD-10-CM | POA: Diagnosis not present

## 2015-11-29 DIAGNOSIS — J449 Chronic obstructive pulmonary disease, unspecified: Secondary | ICD-10-CM | POA: Diagnosis not present

## 2016-01-11 DIAGNOSIS — Z9181 History of falling: Secondary | ICD-10-CM | POA: Diagnosis not present

## 2016-01-11 DIAGNOSIS — Z1389 Encounter for screening for other disorder: Secondary | ICD-10-CM | POA: Diagnosis not present

## 2016-01-11 DIAGNOSIS — J449 Chronic obstructive pulmonary disease, unspecified: Secondary | ICD-10-CM | POA: Diagnosis not present

## 2016-01-11 DIAGNOSIS — Z681 Body mass index (BMI) 19 or less, adult: Secondary | ICD-10-CM | POA: Diagnosis not present

## 2016-01-11 DIAGNOSIS — R634 Abnormal weight loss: Secondary | ICD-10-CM | POA: Diagnosis not present

## 2016-01-11 DIAGNOSIS — E785 Hyperlipidemia, unspecified: Secondary | ICD-10-CM | POA: Diagnosis not present

## 2016-01-11 DIAGNOSIS — I10 Essential (primary) hypertension: Secondary | ICD-10-CM | POA: Diagnosis not present

## 2016-01-11 DIAGNOSIS — M79602 Pain in left arm: Secondary | ICD-10-CM | POA: Diagnosis not present

## 2016-01-11 DIAGNOSIS — K591 Functional diarrhea: Secondary | ICD-10-CM | POA: Diagnosis not present

## 2016-04-17 ENCOUNTER — Encounter: Payer: Self-pay | Admitting: Family

## 2016-04-24 ENCOUNTER — Ambulatory Visit (INDEPENDENT_AMBULATORY_CARE_PROVIDER_SITE_OTHER): Payer: Medicare HMO | Admitting: Family

## 2016-04-24 ENCOUNTER — Encounter: Payer: Self-pay | Admitting: Family

## 2016-04-24 VITALS — BP 147/77 | HR 56 | Ht 61.0 in | Wt 88.3 lb

## 2016-04-24 DIAGNOSIS — Z9889 Other specified postprocedural states: Secondary | ICD-10-CM

## 2016-04-24 DIAGNOSIS — Z8679 Personal history of other diseases of the circulatory system: Secondary | ICD-10-CM

## 2016-04-24 DIAGNOSIS — K551 Chronic vascular disorders of intestine: Secondary | ICD-10-CM | POA: Diagnosis not present

## 2016-04-24 DIAGNOSIS — I701 Atherosclerosis of renal artery: Secondary | ICD-10-CM

## 2016-04-24 DIAGNOSIS — I712 Thoracic aortic aneurysm, without rupture, unspecified: Secondary | ICD-10-CM

## 2016-04-24 DIAGNOSIS — I722 Aneurysm of renal artery: Secondary | ICD-10-CM
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
## 2016-04-24 NOTE — Patient Instructions (Addendum)
Thoracic Aortic Aneurysm An aneurysm is a bulge in an artery. It happens when the wall of the artery is weakened or damaged. If the aneurysm gets too big, it bursts (ruptures) and severe bleeding occurs. A thoracic aortic aneurysm is an aneurysm that occurs in the first part of the aorta, between the heart and the diaphragm. The aorta is the main artery and supplies blood from the heart to the rest of the body. A thoracic aortic aneurysm can enlarge and rupture or blood can flow between the layers of the wall of the aorta through a tear (aorticdissection). Both of these conditions can cause bleeding inside the body and can be life threatening unless diagnosed and treated promptly. CAUSES  The exact cause of a thoracic aortic aneurysm is often unknown. Some contributing factors are:   A hardening of the arteries caused by the buildup of fat and other substances in the lining of a blood vessel (arteriosclerosis).  Inflammation of the walls of an artery (arteritis).  Connective tissue diseases, such as Marfan syndrome.  Injury or trauma to the aorta.  An infection, such as syphilis or staphylococcus, in the wall of the aorta (infectious aortitis) caused by bacteria. RISK FACTORS  Risk factors that contribute to a thoracic aortic aneurysm may include:  Age older than 19 years.  High blood pressure (hypertension).  Female gender.  Ethnicity (white race).  Obesity.  Family history of aneurysm (first degree relatives only).  Tobacco use. PREVENTION  The following healthy lifestyle habits may help decrease your risk of a thoracic aortic aneurysm:  Quitting smoking. Smoking can raise your blood pressure and cause arteriosclerosis.  Limiting or avoiding alcohol.  Keeping your blood pressure, blood sugar level, and cholesterol levels within normal limits.  Decreasing your salt intake. In some people, too much salt can raise blood pressure and increase your risk of abdominal aortic  aneurysm.  Eating a diet low in saturated fats and cholesterol.  Increasing your fiber intake by including whole grains, vegetables, and fruits in your diet. Eating these foods may help lower blood pressure.  Maintaining a healthy weight.  Staying physically active and exercising regularly. SYMPTOMS  The symptoms of thoracic aortic aneurysm may vary depending on the size and rate of growth of the aneurysm. Most grow slowly and do not have any symptoms. When symptoms do occur, they may include:  Pain (chest, back, sides, or abdomen). The pain may vary in intensity. A sudden onset of severe pain may indicate that the aneurysm has ruptured.  Hoarseness.  Cough.  Shortness of breath.  Swallowing problems.  Nausea or vomiting or both. DIAGNOSIS  Since most unruptured thoracic aortic aneurysms have no symptoms, they are often discovered during diagnostic exams for other conditions. An aneurysm may be found during the following procedures:  Ultrasonography (a one-time screening for thoracic aortic aneurysm by ultrasonography is also recommended for all men aged 63-75 years who have ever smoked).  X-ray exams.  A CT scan.  An MRI.  Angiography or arteriography. TREATMENT  Treatment of a thoracic aortic aneurysm depends on the size of your aneurysm, your age, and risk factors for rupture. Medicine to control blood pressure and pain may be used to manage aneurysms smaller than 2.3 in (6 cm). Regular monitoring for enlargement may be recommended by your health care provider if:  The aneurysm is 1.2-1.5 in (3-4 cm) in size (an annual ultrasonography may be recommended).  The aneurysm is 1.5-1.8 in (4-4.5 cm) in size (an ultrasonography every 6  months may be recommended).  The aneurysm is larger than 1.8 in (4.5 cm) in size (your health care provider may ask that you be examined by a vascular surgeon). If your aneurysm is larger than 2.2 in (5.5 cm) or if it is enlarging quickly,  surgical repair may be recommended. There are two main methods for repair of an aneurysm:   Endovascular repair (a minimally invasive surgery).  Open repair. This method is used if an endovascular repair is not possible.   This information is not intended to replace advice given to you by your health care provider. Make sure you discuss any questions you have with your health care provider.   Document Released: 08/06/2005 Document Revised: 05/27/2013 Document Reviewed: 02/16/2013 Elsevier Interactive Patient Education 2016 Else[employer]    Chronic Mesenteric Ischemia Mesenteric ischemia is a deficiency of blood in an area of the intestine supplied by an artery that supports the intestine. Chronic mesenteric ischemia, also called intestinal angina, is a long-term condition. It happens when an artery or vein that supports the intestine gradually becomes blocked or narrow, restricting the blood supply to the intestine. When the blood supply to the intestine is severely restricted, the intestines cannot function properly because needed oxygen cannot reach them.  CAUSES   Fatty deposits that build up in an artery or vein but have not yet restricted blood flow entirely.  Differences in some people's anatomy.  Rapid weight loss.  Weakened areas in blood vessel walls (aneurysms).  Swelling and inflammation of blood vessels (such as from fibromuscular dysplasia and arteritis).  Disorders of blood clotting.  Scarring and fibrosis of blood vessels after radiation therapy.  Blood vessel problems after drug use, such as use of cocaine. RISK FACTORS  Being female.  Being over age 64 with a history of coronary or vascular disease.  Smoking.  Congestive heart failure.  Diabetes.  High cholesterol.  High blood pressure (hypertension). SIGNS AND SYMPTOMS   Severe stomachache. Some people become fearful of eating because of pain.   Abdominal pain or cramps that develop about 30  minutes after a meal.   Abdominal pain after eating that becomes worse over time.   Diarrhea.   Nausea.   Vomiting.   Bloating.   Weight loss. DIAGNOSIS  Chronic mesenteric ischemia is often diagnosed after the person's history is taken, a physical exam is done, and tests are taken. Tests may include:  Ultrasounds.  CT scans.  Angiography. This is an imaging test that uses a dye to obtain a picture of blood flow to the intestine.  Endoscopy. This involves putting a scope through the mouth, down the throat, and into the stomach and intestine to view the intestinal wall and take small tissue samples (biopsies).  Tonometry. In this test a tiny probe is passed through the mouth and into the stomach or intestine and left in place for 24 hours or more. It measures the output of carbon dioxide by the affected tissues. TREATMENT  Treatment may include:   Medicines to reduce blood clotting and increase blood flow.   Surgery to remove the blockage, repair arteries or veins, and restore blood flow. This may involve:   Angioplasty. This is surgery to widen the affected artery, reduce the blockage, and sometimes insert a small, mesh tube (stent).   Bypass surgery. This may be performed to bypass the blockage and reconnect healthy arteries or veins.   A stent in the affected area to help keep blocked arteries open. HOME CARE INSTRUCTIONS  Only take over-the-counter or prescription medicines as directed by your health care provider.   Keep all follow-up appointments as directed by your health care provider.   Prevent the condition from occurring by:  Doing regular exercise.  Keeping a healthy weight.  Keeping a healthy diet.  Managing cholesterol levels.  Keeping blood pressure and heart rhythm problems under control.  Not smoking. SEEK IMMEDIATE MEDICAL CARE IF:  You have severe abdominal pain.   You notice blood in your stool.   You have nausea,  vomiting, or diarrhea.   You have a fever. MAKE SURE YOU:  Understand these instructions.  Will watch your condition.  Will get help right away if you are not doing well or get worse.   This information is not intended to replace advice given to you by your health care provider. Make sure you discuss any questions you have with your health care provider.   Document Released: 03/26/2011 Document Revised: 04/08/2013 Document Reviewed: 02/04/2013 Elsevier Interactive Patient Education [employer].

## 2016-04-27 NOTE — Addendum Note (Signed)
Addended by: Kaleen Mask on: 04/27/2016 03:13 PM   Modules accepted: Orders

## 2016-05-22 DIAGNOSIS — E785 Hyperlipidemia, unspecified: Secondary | ICD-10-CM | POA: Diagnosis not present

## 2016-05-22 DIAGNOSIS — J449 Chronic obstructive pulmonary disease, unspecified: Secondary | ICD-10-CM | POA: Diagnosis not present

## 2016-05-22 DIAGNOSIS — R69 Illness, unspecified: Secondary | ICD-10-CM | POA: Diagnosis not present

## 2016-05-22 DIAGNOSIS — E559 Vitamin D deficiency, unspecified: Secondary | ICD-10-CM | POA: Diagnosis not present

## 2016-05-22 DIAGNOSIS — K1379 Other lesions of oral mucosa: Secondary | ICD-10-CM | POA: Diagnosis not present

## 2016-05-22 DIAGNOSIS — I10 Essential (primary) hypertension: Secondary | ICD-10-CM | POA: Diagnosis not present

## 2016-05-29 DIAGNOSIS — H353131 Nonexudative age-related macular degeneration, bilateral, early dry stage: Secondary | ICD-10-CM | POA: Diagnosis not present

## 2016-05-29 DIAGNOSIS — Z961 Presence of intraocular lens: Secondary | ICD-10-CM | POA: Diagnosis not present

## 2016-07-13 DIAGNOSIS — Z23 Encounter for immunization: Secondary | ICD-10-CM | POA: Diagnosis not present

## 2016-08-16 DIAGNOSIS — L728 Other follicular cysts of the skin and subcutaneous tissue: Secondary | ICD-10-CM | POA: Diagnosis not present

## 2016-08-16 DIAGNOSIS — C44622 Squamous cell carcinoma of skin of right upper limb, including shoulder: Secondary | ICD-10-CM | POA: Diagnosis not present

## 2016-08-17 DIAGNOSIS — E663 Overweight: Secondary | ICD-10-CM | POA: Diagnosis not present

## 2016-08-17 DIAGNOSIS — Z681 Body mass index (BMI) 19 or less, adult: Secondary | ICD-10-CM | POA: Diagnosis not present

## 2016-08-17 DIAGNOSIS — J440 Chronic obstructive pulmonary disease with acute lower respiratory infection: Secondary | ICD-10-CM | POA: Diagnosis not present

## 2016-09-08 DIAGNOSIS — S61412A Laceration without foreign body of left hand, initial encounter: Secondary | ICD-10-CM | POA: Diagnosis not present

## 2016-09-10 DIAGNOSIS — Z681 Body mass index (BMI) 19 or less, adult: Secondary | ICD-10-CM | POA: Diagnosis not present

## 2016-09-10 DIAGNOSIS — S61401A Unspecified open wound of right hand, initial encounter: Secondary | ICD-10-CM | POA: Diagnosis not present

## 2016-09-18 ENCOUNTER — Encounter: Payer: Self-pay | Admitting: Vascular Surgery

## 2016-09-25 ENCOUNTER — Other Ambulatory Visit: Payer: Medicare HMO

## 2016-09-25 ENCOUNTER — Ambulatory Visit: Payer: Medicare HMO | Admitting: Vascular Surgery

## 2016-09-25 ENCOUNTER — Inpatient Hospital Stay: Admission: RE | Admit: 2016-09-25 | Payer: Medicare HMO | Source: Ambulatory Visit

## 2016-09-27 DIAGNOSIS — L03113 Cellulitis of right upper limb: Secondary | ICD-10-CM | POA: Diagnosis not present

## 2016-09-27 DIAGNOSIS — S61401A Unspecified open wound of right hand, initial encounter: Secondary | ICD-10-CM | POA: Diagnosis not present

## 2016-09-27 DIAGNOSIS — Z681 Body mass index (BMI) 19 or less, adult: Secondary | ICD-10-CM | POA: Diagnosis not present

## 2016-10-16 ENCOUNTER — Encounter: Payer: Self-pay | Admitting: Vascular Surgery

## 2016-10-22 ENCOUNTER — Other Ambulatory Visit: Payer: Self-pay | Admitting: Vascular Surgery

## 2016-10-22 ENCOUNTER — Other Ambulatory Visit: Payer: Self-pay | Admitting: Family

## 2016-10-22 DIAGNOSIS — I714 Abdominal aortic aneurysm, without rupture, unspecified: Secondary | ICD-10-CM

## 2016-10-23 ENCOUNTER — Encounter: Payer: Self-pay | Admitting: Vascular Surgery

## 2016-10-23 ENCOUNTER — Ambulatory Visit (INDEPENDENT_AMBULATORY_CARE_PROVIDER_SITE_OTHER): Payer: Medicare HMO | Admitting: Vascular Surgery

## 2016-10-23 ENCOUNTER — Ambulatory Visit
Admission: RE | Admit: 2016-10-23 | Discharge: 2016-10-23 | Disposition: A | Discharge status: Home / Self Care | Payer: Medicare HMO | Source: Ambulatory Visit | Attending: Family | Admitting: Family

## 2016-10-23 ENCOUNTER — Ambulatory Visit: Payer: Medicare HMO

## 2016-10-23 VITALS — BP 174/89 | HR 76 | Temp 97.3°F | Resp 16 | Ht 61.0 in | Wt 83.0 lb

## 2016-10-23 DIAGNOSIS — I714 Abdominal aortic aneurysm, without rupture: Secondary | ICD-10-CM | POA: Diagnosis not present

## 2016-10-23 DIAGNOSIS — I77819 Aortic ectasia, unspecified site: Secondary | ICD-10-CM

## 2016-10-23 DIAGNOSIS — I712 Thoracic aortic aneurysm, without rupture, unspecified: Secondary | ICD-10-CM

## 2016-10-23 DIAGNOSIS — K551 Chronic vascular disorders of intestine: Secondary | ICD-10-CM

## 2016-10-23 DIAGNOSIS — I701 Atherosclerosis of renal artery: Secondary | ICD-10-CM

## 2016-10-23 MED ORDER — IOPAMIDOL (ISOVUE-370) INJECTION 76%
75.0000 mL | Freq: Once | INTRAVENOUS | Status: AC | PRN
  Administered 2016-10-23: 125 mL via INTRAVENOUS

## 2016-10-23 NOTE — Progress Notes (Signed)
[facility] [place]  Patient name: Tina Macdonald MRN: [id number] DOB: Oct 04, 1927 Sex: female  REASON FOR VISIT: two year follow-up with CTA chest/abd/pelv  HPI: Tina Macdonald is a 83 y.o. female who presents for continued follow-up of multiple vascular issues. She is s/p open AA repair in 2007 by Dr. Amedeo Plenty. She has known ectasia of her thoracic aorta that measured up to 4.2 cm two years ago. She also previously underwent right renal artery stenting.  Since she was last seen two years ago, she reports a drop in energy level. She is still driving and completing daily tasks, but tires out more easily. She denies any chest pain, back pain or abdominal pain.   She takes aspirin, plavix and a statin daily. She is on metoprolol and HCTZ for hypertension.   Past Medical History:  Diagnosis Date  . AAA (abdominal aortic aneurysm) (Tina Macdonald)   . Arthritis   . COPD (chronic obstructive pulmonary disease) ([place])   . Hyperlipidemia   . Hypertension   . Renal artery stenosis (Tina Macdonald)   . Thoracic aortic ectasia ([facility])     No family history on file.  SOCIAL HISTORY: Social History  Substance Use Topics  . Smoking status: Former Smoker    Years: 25.00    Types: Cigarettes    Quit date: 08/20/1992  . Smokeless tobacco: Never Used  . Alcohol use No    Allergies  Allergen Reactions  . Niacin And Related Itching    Current Outpatient Prescriptions  Medication Sig Dispense Refill  . acetaminophen (TYLENOL) 325 MG tablet Take 650 mg by mouth every 6 (six) hours as needed.    Marland Kitchen aspirin 81 MG tablet Take 160 mg by mouth daily.    . clopidogrel (PLAVIX) 75 MG tablet Take 75 mg by mouth daily.    Marland Kitchen co-enzyme Q-10 30 MG capsule Take 30 mg by mouth daily.    . hydrochlorothiazide (HYDRODIURIL) 25 MG tablet Take 25 mg by mouth daily.    . metoprolol succinate (TOPROL-XL) 50 MG 24 hr tablet Take 50 mg by mouth daily. Take with or immediately following a meal.    . niacin  (NIASPAN) 500 MG CR tablet Take 500 mg by mouth at bedtime.    . simvastatin (ZOCOR) 80 MG tablet Take 80 mg by mouth at bedtime.     No current facility-administered medications for this visit.     REVIEW OF SYSTEMS:  [X]  denotes positive finding, [ ]  denotes negative finding Cardiac  Comments:  Chest pain or chest pressure:    Shortness of breath upon exertion:    Short of breath when lying flat:    Irregular heart rhythm:        Vascular    Pain in calf, thigh, or hip brought on by ambulation:    Pain in feet at night that wakes you up from your sleep:     Blood clot in your veins:    Leg swelling:         Pulmonary    Oxygen at home:    Productive cough:     Wheezing:         Neurologic    Sudden weakness in arms or legs:     Sudden numbness in arms or legs:     Sudden onset of difficulty speaking or slurred speech:    Temporary loss of vision in one eye:     Problems with dizziness:  Gastrointestinal    Blood in stool:     Vomited blood:         Genitourinary    Burning when urinating:     Blood in urine:        Psychiatric    Major depression:         Hematologic    Bleeding problems:    Problems with blood clotting too easily:        Skin    Rashes or ulcers:        Constitutional    Fever or chills:      PHYSICAL EXAM: Vitals:   10/23/16 1331  BP: (!) 174/89  Pulse: 76  Resp: 16  Temp: 97.3 F (36.3 C)  TempSrc: Oral  SpO2: 93%  Weight: 83 lb (37.6 kg)  Height: 5\' 1"  (1.549 m)    GENERAL: The patient is a well-nourished, thin female, in no acute distress. The vital signs are documented above. CARDIAC: There is a regular rate and rhythm.  VASCULAR: 2+ radial pulses and 2+ dorsalis pedis pulses bilaterally.  PULMONARY: There is good air exchange bilaterally without wheezing or rales. ABDOMEN: Soft and non-tender. Palpable aorta. Non pulsatile.  MUSCULOSKELETAL: There are no major deformities or cyanosis. NEUROLOGIC: No focal  weakness or paresthesias are detected. SKIN: There are no ulcers or rashes noted. PSYCHIATRIC: The patient has a normal affect.  DATA:  CTA chest/abd/pelv 10/23/16  There has been an interval enlargement of descending thoracic aorta up to 4.8 cm. There is some irregular mural thrombus of the supra renal abdominal aorta. Patent right renal artery stent.   MEDICAL ISSUES: Asymptomatic thoracic aorta ectasia, mural thrombus abdominal aorta  There has been some enlargement of her descending thoracic aorta up to 4.8 cm. She has been asymptomatic. Would not recommend any surgical intervention at this time, nor future intervention as she is unlikely to tolerate procedure. She does have some irregular mural thrombus in the suprarenal abdominal aorta with concerning for embolization, however she has been asymptomatic. Discussed symptoms of an acutely ischemic leg and advised patient to go to ED in that event. Offered continued follow-up versus prn follow-up and patient would like a repeat CTA in 2 years. Follow-up with CTA chest/abd/pelvis in two years.    Virgina Jock, PA-C [facility] MD: Early  I have examined the patient, reviewed and agree with above. CT scan does show some enlargement. I discussed this at length with the patient and her family. Her advanced age do not feel she would be a candidate for treatment. Certainly not at the 4.8 cm size. I gave her the option of discontinued follow-up versus checking this again at 2 year interval and she wishes to check this again. She is 83 years of age currently but does remain in stable health. We will see her again in 2 years for continued follow-up  Curt Jews, MD 10/23/2016 4:32 PM

## 2016-10-30 NOTE — Addendum Note (Signed)
Addended by: Lianne Cure A on: 10/30/2016 12:19 PM   Modules accepted: Orders

## 2016-11-09 DIAGNOSIS — I10 Essential (primary) hypertension: Secondary | ICD-10-CM | POA: Diagnosis not present

## 2016-11-09 DIAGNOSIS — Z681 Body mass index (BMI) 19 or less, adult: Secondary | ICD-10-CM | POA: Diagnosis not present

## 2016-11-09 DIAGNOSIS — S61401A Unspecified open wound of right hand, initial encounter: Secondary | ICD-10-CM | POA: Diagnosis not present

## 2016-11-30 DIAGNOSIS — I10 Essential (primary) hypertension: Secondary | ICD-10-CM | POA: Diagnosis not present

## 2016-11-30 DIAGNOSIS — Z681 Body mass index (BMI) 19 or less, adult: Secondary | ICD-10-CM | POA: Diagnosis not present

## 2016-12-28 DIAGNOSIS — R001 Bradycardia, unspecified: Secondary | ICD-10-CM | POA: Diagnosis not present

## 2016-12-28 DIAGNOSIS — Z681 Body mass index (BMI) 19 or less, adult: Secondary | ICD-10-CM | POA: Diagnosis not present

## 2016-12-28 DIAGNOSIS — I10 Essential (primary) hypertension: Secondary | ICD-10-CM | POA: Diagnosis not present

## 2017-01-25 DIAGNOSIS — I10 Essential (primary) hypertension: Secondary | ICD-10-CM | POA: Diagnosis not present

## 2017-01-25 DIAGNOSIS — J449 Chronic obstructive pulmonary disease, unspecified: Secondary | ICD-10-CM | POA: Diagnosis not present

## 2017-01-25 DIAGNOSIS — Z1389 Encounter for screening for other disorder: Secondary | ICD-10-CM | POA: Diagnosis not present

## 2017-01-25 DIAGNOSIS — Z9181 History of falling: Secondary | ICD-10-CM | POA: Diagnosis not present

## 2017-02-22 DIAGNOSIS — K580 Irritable bowel syndrome with diarrhea: Secondary | ICD-10-CM | POA: Diagnosis not present

## 2017-02-22 DIAGNOSIS — M62838 Other muscle spasm: Secondary | ICD-10-CM | POA: Diagnosis not present

## 2017-02-22 DIAGNOSIS — Z681 Body mass index (BMI) 19 or less, adult: Secondary | ICD-10-CM | POA: Diagnosis not present

## 2017-02-22 DIAGNOSIS — I10 Essential (primary) hypertension: Secondary | ICD-10-CM | POA: Diagnosis not present

## 2017-03-21 DIAGNOSIS — I10 Essential (primary) hypertension: Secondary | ICD-10-CM | POA: Diagnosis not present

## 2017-04-11 DIAGNOSIS — Z681 Body mass index (BMI) 19 or less, adult: Secondary | ICD-10-CM | POA: Diagnosis not present

## 2017-04-11 DIAGNOSIS — I10 Essential (primary) hypertension: Secondary | ICD-10-CM | POA: Diagnosis not present

## 2017-04-15 DIAGNOSIS — J440 Chronic obstructive pulmonary disease with acute lower respiratory infection: Secondary | ICD-10-CM | POA: Diagnosis not present

## 2017-04-15 DIAGNOSIS — Z681 Body mass index (BMI) 19 or less, adult: Secondary | ICD-10-CM | POA: Diagnosis not present

## 2017-04-15 DIAGNOSIS — I10 Essential (primary) hypertension: Secondary | ICD-10-CM | POA: Diagnosis not present

## 2017-04-15 DIAGNOSIS — Z8669 Personal history of other diseases of the nervous system and sense organs: Secondary | ICD-10-CM | POA: Diagnosis not present

## 2017-04-15 DIAGNOSIS — Z79899 Other long term (current) drug therapy: Secondary | ICD-10-CM | POA: Diagnosis not present

## 2017-04-15 DIAGNOSIS — Z0000 Encounter for general adult medical examination without abnormal findings: Secondary | ICD-10-CM | POA: Diagnosis not present

## 2017-04-15 DIAGNOSIS — E7800 Pure hypercholesterolemia, unspecified: Secondary | ICD-10-CM | POA: Diagnosis not present

## 2017-04-15 DIAGNOSIS — H3530 Unspecified macular degeneration: Secondary | ICD-10-CM | POA: Diagnosis not present

## 2017-04-15 DIAGNOSIS — I712 Thoracic aortic aneurysm, without rupture: Secondary | ICD-10-CM | POA: Diagnosis not present

## 2017-04-15 DIAGNOSIS — K551 Chronic vascular disorders of intestine: Secondary | ICD-10-CM | POA: Diagnosis not present

## 2017-05-08 DIAGNOSIS — I10 Essential (primary) hypertension: Secondary | ICD-10-CM | POA: Diagnosis not present

## 2017-05-08 DIAGNOSIS — I77819 Aortic ectasia, unspecified site: Secondary | ICD-10-CM | POA: Diagnosis not present

## 2017-05-08 DIAGNOSIS — R05 Cough: Secondary | ICD-10-CM | POA: Diagnosis not present

## 2017-05-08 DIAGNOSIS — I253 Aneurysm of heart: Secondary | ICD-10-CM | POA: Diagnosis not present

## 2017-05-08 DIAGNOSIS — Z681 Body mass index (BMI) 19 or less, adult: Secondary | ICD-10-CM | POA: Diagnosis not present

## 2017-06-05 DIAGNOSIS — N289 Disorder of kidney and ureter, unspecified: Secondary | ICD-10-CM | POA: Diagnosis not present

## 2017-06-21 DIAGNOSIS — Z681 Body mass index (BMI) 19 or less, adult: Secondary | ICD-10-CM | POA: Diagnosis not present

## 2017-06-21 DIAGNOSIS — N390 Urinary tract infection, site not specified: Secondary | ICD-10-CM | POA: Diagnosis not present

## 2017-06-21 DIAGNOSIS — I10 Essential (primary) hypertension: Secondary | ICD-10-CM | POA: Diagnosis not present

## 2017-07-08 DIAGNOSIS — Z961 Presence of intraocular lens: Secondary | ICD-10-CM | POA: Diagnosis not present

## 2017-07-08 DIAGNOSIS — H353131 Nonexudative age-related macular degeneration, bilateral, early dry stage: Secondary | ICD-10-CM | POA: Diagnosis not present

## 2017-07-09 DIAGNOSIS — E785 Hyperlipidemia, unspecified: Secondary | ICD-10-CM | POA: Diagnosis not present

## 2017-07-09 DIAGNOSIS — Z9181 History of falling: Secondary | ICD-10-CM | POA: Diagnosis not present

## 2017-07-09 DIAGNOSIS — Z0000 Encounter for general adult medical examination without abnormal findings: Secondary | ICD-10-CM | POA: Diagnosis not present

## 2017-07-09 DIAGNOSIS — Z1331 Encounter for screening for depression: Secondary | ICD-10-CM | POA: Diagnosis not present

## 2017-07-09 DIAGNOSIS — Z136 Encounter for screening for cardiovascular disorders: Secondary | ICD-10-CM | POA: Diagnosis not present

## 2017-07-09 DIAGNOSIS — Z23 Encounter for immunization: Secondary | ICD-10-CM | POA: Diagnosis not present

## 2017-08-28 DIAGNOSIS — J019 Acute sinusitis, unspecified: Secondary | ICD-10-CM | POA: Diagnosis not present

## 2017-11-01 DIAGNOSIS — R05 Cough: Secondary | ICD-10-CM | POA: Diagnosis not present

## 2017-11-01 DIAGNOSIS — S51002A Unspecified open wound of left elbow, initial encounter: Secondary | ICD-10-CM | POA: Diagnosis not present

## 2017-11-05 DIAGNOSIS — E785 Hyperlipidemia, unspecified: Secondary | ICD-10-CM | POA: Diagnosis not present

## 2017-11-05 DIAGNOSIS — I10 Essential (primary) hypertension: Secondary | ICD-10-CM | POA: Diagnosis not present

## 2017-11-05 DIAGNOSIS — Z681 Body mass index (BMI) 19 or less, adult: Secondary | ICD-10-CM | POA: Diagnosis not present

## 2017-11-12 DIAGNOSIS — R69 Illness, unspecified: Secondary | ICD-10-CM | POA: Diagnosis not present

## 2017-11-12 DIAGNOSIS — I10 Essential (primary) hypertension: Secondary | ICD-10-CM | POA: Diagnosis not present

## 2017-11-12 DIAGNOSIS — Z681 Body mass index (BMI) 19 or less, adult: Secondary | ICD-10-CM | POA: Diagnosis not present

## 2017-11-12 DIAGNOSIS — E785 Hyperlipidemia, unspecified: Secondary | ICD-10-CM | POA: Diagnosis not present

## 2017-11-12 DIAGNOSIS — R531 Weakness: Secondary | ICD-10-CM | POA: Diagnosis not present

## 2017-11-26 DIAGNOSIS — R69 Illness, unspecified: Secondary | ICD-10-CM | POA: Diagnosis not present

## 2017-11-26 DIAGNOSIS — I10 Essential (primary) hypertension: Secondary | ICD-10-CM | POA: Diagnosis not present

## 2017-12-24 DIAGNOSIS — I10 Essential (primary) hypertension: Secondary | ICD-10-CM | POA: Diagnosis not present

## 2017-12-24 DIAGNOSIS — R69 Illness, unspecified: Secondary | ICD-10-CM | POA: Diagnosis not present

## 2018-01-28 DIAGNOSIS — R32 Unspecified urinary incontinence: Secondary | ICD-10-CM | POA: Diagnosis not present

## 2018-01-28 DIAGNOSIS — I951 Orthostatic hypotension: Secondary | ICD-10-CM | POA: Diagnosis not present

## 2018-01-28 DIAGNOSIS — I10 Essential (primary) hypertension: Secondary | ICD-10-CM | POA: Diagnosis not present

## 2018-01-28 DIAGNOSIS — M810 Age-related osteoporosis without current pathological fracture: Secondary | ICD-10-CM | POA: Diagnosis not present

## 2018-01-28 DIAGNOSIS — I739 Peripheral vascular disease, unspecified: Secondary | ICD-10-CM | POA: Diagnosis not present

## 2018-01-28 DIAGNOSIS — R69 Illness, unspecified: Secondary | ICD-10-CM | POA: Diagnosis not present

## 2018-01-28 DIAGNOSIS — M19049 Primary osteoarthritis, unspecified hand: Secondary | ICD-10-CM | POA: Diagnosis not present

## 2018-01-28 DIAGNOSIS — R64 Cachexia: Secondary | ICD-10-CM | POA: Diagnosis not present

## 2018-01-28 DIAGNOSIS — K219 Gastro-esophageal reflux disease without esophagitis: Secondary | ICD-10-CM | POA: Diagnosis not present

## 2018-02-06 DIAGNOSIS — E785 Hyperlipidemia, unspecified: Secondary | ICD-10-CM | POA: Diagnosis not present

## 2018-04-05 ENCOUNTER — Emergency Department (HOSPITAL_COMMUNITY): Payer: Medicare HMO

## 2018-04-05 ENCOUNTER — Encounter (HOSPITAL_COMMUNITY): Payer: Self-pay | Admitting: Radiology

## 2018-04-05 ENCOUNTER — Other Ambulatory Visit: Payer: Self-pay

## 2018-04-05 ENCOUNTER — Observation Stay (HOSPITAL_COMMUNITY)
Admission: EM | Admit: 2018-04-05 | Discharge: 2018-04-20 | Disposition: E | Payer: Medicare HMO | Attending: General Surgery | Admitting: General Surgery

## 2018-04-05 DIAGNOSIS — M4050 Lordosis, unspecified, site unspecified: Secondary | ICD-10-CM | POA: Insufficient documentation

## 2018-04-05 DIAGNOSIS — S199XXA Unspecified injury of neck, initial encounter: Secondary | ICD-10-CM | POA: Diagnosis not present

## 2018-04-05 DIAGNOSIS — Z79899 Other long term (current) drug therapy: Secondary | ICD-10-CM | POA: Diagnosis not present

## 2018-04-05 DIAGNOSIS — S064X9A Epidural hemorrhage with loss of consciousness of unspecified duration, initial encounter: Secondary | ICD-10-CM

## 2018-04-05 DIAGNOSIS — W19XXXA Unspecified fall, initial encounter: Secondary | ICD-10-CM | POA: Diagnosis present

## 2018-04-05 DIAGNOSIS — R1111 Vomiting without nausea: Secondary | ICD-10-CM | POA: Diagnosis not present

## 2018-04-05 DIAGNOSIS — I10 Essential (primary) hypertension: Secondary | ICD-10-CM | POA: Diagnosis not present

## 2018-04-05 DIAGNOSIS — R404 Transient alteration of awareness: Secondary | ICD-10-CM | POA: Diagnosis not present

## 2018-04-05 DIAGNOSIS — I700 Atherosclerosis of aorta: Secondary | ICD-10-CM | POA: Insufficient documentation

## 2018-04-05 DIAGNOSIS — S72141A Displaced intertrochanteric fracture of right femur, initial encounter for closed fracture: Secondary | ICD-10-CM | POA: Diagnosis not present

## 2018-04-05 DIAGNOSIS — Z66 Do not resuscitate: Secondary | ICD-10-CM | POA: Diagnosis not present

## 2018-04-05 DIAGNOSIS — N281 Cyst of kidney, acquired: Secondary | ICD-10-CM | POA: Diagnosis not present

## 2018-04-05 DIAGNOSIS — R402432 Glasgow coma scale score 3-8, at arrival to emergency department: Secondary | ICD-10-CM | POA: Diagnosis not present

## 2018-04-05 DIAGNOSIS — S0003XA Contusion of scalp, initial encounter: Secondary | ICD-10-CM | POA: Diagnosis not present

## 2018-04-05 DIAGNOSIS — S299XXA Unspecified injury of thorax, initial encounter: Secondary | ICD-10-CM | POA: Diagnosis not present

## 2018-04-05 DIAGNOSIS — S3991XA Unspecified injury of abdomen, initial encounter: Secondary | ICD-10-CM | POA: Diagnosis not present

## 2018-04-05 DIAGNOSIS — S065X9A Traumatic subdural hemorrhage with loss of consciousness of unspecified duration, initial encounter: Principal | ICD-10-CM | POA: Insufficient documentation

## 2018-04-05 DIAGNOSIS — R4020 Unspecified coma: Secondary | ICD-10-CM | POA: Diagnosis not present

## 2018-04-05 DIAGNOSIS — G935 Compression of brain: Secondary | ICD-10-CM | POA: Diagnosis not present

## 2018-04-05 DIAGNOSIS — Y92009 Unspecified place in unspecified non-institutional (private) residence as the place of occurrence of the external cause: Secondary | ICD-10-CM | POA: Insufficient documentation

## 2018-04-05 DIAGNOSIS — S2231XA Fracture of one rib, right side, initial encounter for closed fracture: Secondary | ICD-10-CM | POA: Insufficient documentation

## 2018-04-05 DIAGNOSIS — R4182 Altered mental status, unspecified: Secondary | ICD-10-CM | POA: Diagnosis not present

## 2018-04-05 DIAGNOSIS — S064XAA Epidural hemorrhage with loss of consciousness status unknown, initial encounter: Secondary | ICD-10-CM

## 2018-04-05 DIAGNOSIS — S065X0A Traumatic subdural hemorrhage without loss of consciousness, initial encounter: Secondary | ICD-10-CM | POA: Diagnosis not present

## 2018-04-05 DIAGNOSIS — R0902 Hypoxemia: Secondary | ICD-10-CM | POA: Diagnosis not present

## 2018-04-05 DIAGNOSIS — S064X0A Epidural hemorrhage without loss of consciousness, initial encounter: Secondary | ICD-10-CM | POA: Diagnosis not present

## 2018-04-05 DIAGNOSIS — S0990XA Unspecified injury of head, initial encounter: Secondary | ICD-10-CM | POA: Diagnosis not present

## 2018-04-05 LAB — TYPE AND SCREEN
ABO/RH(D): A POS
Antibody Screen: NEGATIVE
UNIT DIVISION: 0
Unit division: 0

## 2018-04-05 LAB — I-STAT CHEM 8, ED
BUN: 31 mg/dL — AB (ref 8–23)
CHLORIDE: 101 mmol/L (ref 98–111)
CREATININE: 1 mg/dL (ref 0.44–1.00)
Calcium, Ion: 1.14 mmol/L — ABNORMAL LOW (ref 1.15–1.40)
Glucose, Bld: 179 mg/dL — ABNORMAL HIGH (ref 70–99)
HEMATOCRIT: 36 % (ref 36.0–46.0)
Hemoglobin: 12.2 g/dL (ref 12.0–15.0)
POTASSIUM: 3.5 mmol/L (ref 3.5–5.1)
Sodium: 141 mmol/L (ref 135–145)
TCO2: 25 mmol/L (ref 22–32)

## 2018-04-05 LAB — COMPREHENSIVE METABOLIC PANEL
ALK PHOS: 66 U/L (ref 38–126)
ALT: 21 U/L (ref 0–44)
AST: 33 U/L (ref 15–41)
Albumin: 3.5 g/dL (ref 3.5–5.0)
Anion gap: 12 (ref 5–15)
BILIRUBIN TOTAL: 1.1 mg/dL (ref 0.3–1.2)
BUN: 24 mg/dL — AB (ref 8–23)
CALCIUM: 9.1 mg/dL (ref 8.9–10.3)
CO2: 25 mmol/L (ref 22–32)
CREATININE: 1.1 mg/dL — AB (ref 0.44–1.00)
Chloride: 102 mmol/L (ref 98–111)
GFR calc Af Amer: 51 mL/min — ABNORMAL LOW (ref >60)
GFR, EST NON AFRICAN AMERICAN: 44 mL/min — AB (ref >60)
Glucose, Bld: 182 mg/dL — ABNORMAL HIGH (ref 70–99)
Potassium: 3.5 mmol/L (ref 3.5–5.1)
Sodium: 139 mmol/L (ref 135–145)
TOTAL PROTEIN: 6.1 g/dL — AB (ref 6.5–8.1)

## 2018-04-05 LAB — BPAM FFP
BLOOD PRODUCT EXPIRATION DATE: 201908212359
BLOOD PRODUCT EXPIRATION DATE: 201908212359
ISSUE DATE / TIME: 201908171559
ISSUE DATE / TIME: 201908171559
UNIT TYPE AND RH: 6200
Unit Type and Rh: 600

## 2018-04-05 LAB — PROTIME-INR
INR: 1.23
Prothrombin Time: 15.4 seconds — ABNORMAL HIGH (ref 11.4–15.2)

## 2018-04-05 LAB — CBC
HCT: 37.1 % (ref 36.0–46.0)
Hemoglobin: 11.9 g/dL — ABNORMAL LOW (ref 12.0–15.0)
MCH: 31.4 pg (ref 26.0–34.0)
MCHC: 32.1 g/dL (ref 30.0–36.0)
MCV: 97.9 fL (ref 78.0–100.0)
PLATELETS: 113 10*3/uL — AB (ref 150–400)
RBC: 3.79 MIL/uL — ABNORMAL LOW (ref 3.87–5.11)
RDW: 13.2 % (ref 11.5–15.5)
WBC: 13.7 10*3/uL — AB (ref 4.0–10.5)

## 2018-04-05 LAB — PREPARE FRESH FROZEN PLASMA
UNIT DIVISION: 0
Unit division: 0

## 2018-04-05 LAB — BPAM RBC
BLOOD PRODUCT EXPIRATION DATE: 201909052359
BLOOD PRODUCT EXPIRATION DATE: 201909052359
ISSUE DATE / TIME: 201908171557
ISSUE DATE / TIME: 201908171557
UNIT TYPE AND RH: 9500
Unit Type and Rh: 9500

## 2018-04-05 LAB — ABO/RH: ABO/RH(D): A POS

## 2018-04-05 LAB — I-STAT CG4 LACTIC ACID, ED: Lactic Acid, Venous: 2.94 mmol/L (ref 0.5–1.9)

## 2018-04-05 LAB — CDS SEROLOGY

## 2018-04-05 LAB — ETHANOL

## 2018-04-05 LAB — MRSA PCR SCREENING: MRSA by PCR: NEGATIVE

## 2018-04-05 MED ORDER — SODIUM CHLORIDE 3 % IV SOLN
INTRAVENOUS | Status: DC
  Administered 2018-04-05: 50 mL/h via INTRAVENOUS
  Filled 2018-04-05 (×2): qty 500

## 2018-04-05 MED ORDER — SODIUM CHLORIDE 0.9 % IV SOLN
INTRAVENOUS | Status: DC
  Administered 2018-04-05: 21:00:00 via INTRAVENOUS

## 2018-04-05 MED ORDER — ORAL CARE MOUTH RINSE: 15.0000 mL | OROMUCOSAL | Status: DC

## 2018-04-05 MED ORDER — CHLORHEXIDINE GLUCONATE 0.12% ORAL RINSE (MEDLINE KIT)
15.0000 mL | Freq: Two times a day (BID) | OROMUCOSAL | Status: DC
  Administered 2018-04-05: 15 mL via OROMUCOSAL

## 2018-04-05 MED ORDER — PROPOFOL 1000 MG/100ML IV EMUL: 5.0000 ug/kg/min | INTRAVENOUS | Status: DC

## 2018-04-05 MED ORDER — CHLORHEXIDINE GLUCONATE 0.12 % MT SOLN: 15.0000 mL | Freq: Two times a day (BID) | OROMUCOSAL | Status: DC

## 2018-04-05 MED ORDER — ORAL CARE MOUTH RINSE: 15.0000 mL | Freq: Two times a day (BID) | OROMUCOSAL | Status: DC

## 2018-04-05 MED ORDER — IOPAMIDOL (ISOVUE-300) INJECTION 61%
100.0000 mL | Freq: Once | INTRAVENOUS | Status: AC | PRN
  Administered 2018-04-05: 100 mL via INTRAVENOUS

## 2018-04-05 MED ORDER — PROPOFOL 1000 MG/100ML IV EMUL
INTRAVENOUS | Status: AC
  Filled 2018-04-05: qty 100

## 2018-04-05 MED ORDER — FENTANYL CITRATE (PF) 100 MCG/2ML IJ SOLN: 50.0000 ug | INTRAMUSCULAR | Status: DC | PRN

## 2018-04-05 NOTE — ED Notes (Signed)
The two brothers here do not want her to have cpr if she codes

## 2018-04-05 NOTE — ED Notes (Addendum)
Number 14 og by brends rn.  Much bruising both arms  Bleeding beneath the skin  ivs difficult to keep in the veins  2nd iv placed slid out from all the blood under her skin

## 2018-04-05 NOTE — ED Notes (Signed)
3 sons are here waiting on another son.  Their minister is at the bedside

## 2018-04-05 NOTE — ED Notes (Signed)
Aspen collar applied

## 2018-04-05 NOTE — ED Notes (Signed)
The pts bp is dropping edp and ed res notified  Family here but have not been in to see the pt

## 2018-04-05 NOTE — Progress Notes (Signed)
Pt status and recs per NSR.  Pt with nonsurvivable TBI. Pt made DNR Pt made comfort care until family arrives Will admit to ICU for comfort measures on Vent

## 2018-04-05 NOTE — ED Notes (Signed)
Pt went to c-t 

## 2018-04-05 NOTE — ED Notes (Signed)
Intubated by ed res

## 2018-04-05 NOTE — ED Notes (Addendum)
Neurosurgeon at the beside

## 2018-04-05 NOTE — ED Notes (Signed)
Bolus finished  580ml

## 2018-04-05 NOTE — ED Notes (Signed)
STILL ATTEMPTING TO INTubate the  pt

## 2018-04-05 NOTE — ED Notes (Signed)
Attempted to call report

## 2018-04-05 NOTE — Consult Note (Signed)
Reason for Consult: Level 2 upgraded to level 1 trauma Referring Physician: Dr. Altamease Oiler Tina Macdonald is an 84 y.o. female.  HPI: Pt is a 51 F who arrived as a level 2 trauma s/p fall at home at 1000.  Pt had neuro changes en route per EMS.  Pt was upgraded to level 1 d/t decline in GCS score.  It was reported per EMS that pt fell onto her hip and head at home.   No blood thinners per EMS.  History reviewed. No pertinent past medical history. No family history on file.  Social History:  has no tobacco, alcohol, and drug history on file.  Allergies:  Allergies  Allergen Reactions  . Niacin And Related Itching    Medications: I have reviewed the patient's current medications.  Results for orders placed or performed during the hospital encounter of 03/29/2018 (from the past 48 hour(s))  Type and screen Ordered by PROVIDER DEFAULT     Status: None (Preliminary result)   Collection Time: 03/28/2018  3:55 PM  Result Value Ref Range   ABO/RH(D) PENDING    Antibody Screen PENDING    Sample Expiration 04/08/2018    Unit Number [id number]    Blood Component Type RCLI PHER 2    Unit division 00    Status of Unit ISSUED    Unit tag comment VERBAL ORDERS PER DR ZAVITZ    Transfusion Status      OK TO TRANSFUSE Performed at [facility], [address] [place], [place] [address]    Crossmatch Result PENDING    Unit Number [id number]    Blood Component Type RCLI PHER 1    Unit division 00    Status of Unit ISSUED    Unit tag comment VERBAL ORDERS PER DR ZAVITZ    Transfusion Status OK TO TRANSFUSE    Crossmatch Result PENDING   Prepare fresh frozen plasma     Status: None (Preliminary result)   Collection Time: 03/20/2018  3:55 PM  Result Value Ref Range   Unit Number [id number]    Blood Component Type THAWED PLASMA    Unit division 00    Status of Unit ISSUED    Unit tag comment VERBAL ORDERS PER DR ZAVITZ    Transfusion Status OK TO TRANSFUSE    Unit Number  [id number]    Blood Component Type THAWED PLASMA    Unit division 00    Status of Unit ISSUED    Unit tag comment VERBAL ORDERS PER DR ZAVITZ    Transfusion Status      OK TO TRANSFUSE Performed at [facility], [address], [place], [place] [address]     No results found.  Review of Systems  Unable to perform ROS: Acuity of condition   Blood pressure (!) 238/153, pulse (!) 102, temperature 98.3 F (36.8 C), SpO2 100 %. Physical Exam  Vitals reviewed. Constitutional: She is oriented to person, place, and time. She appears well-developed and well-nourished. She is cooperative. No distress. Cervical collar and nasal cannula in place.  HENT:  Head: Normocephalic and atraumatic. Head is without raccoon's eyes, without Battle's sign, without abrasion, without contusion and without laceration.    Right Ear: Hearing, tympanic membrane, external ear and ear canal normal. No lacerations. No drainage or tenderness. No foreign bodies. Tympanic membrane is not perforated. No hemotympanum.  Left Ear: Hearing, tympanic membrane, external ear and ear canal normal. No lacerations. No drainage or tenderness. No foreign  bodies. Tympanic membrane is not perforated. No hemotympanum.  Nose: Nose normal. No nose lacerations, sinus tenderness, nasal deformity or nasal septal hematoma. No epistaxis.  Mouth/Throat: Uvula is midline, oropharynx is clear and moist and mucous membranes are normal. No lacerations.  Eyes: Pupils are equal, round, and reactive to light. Conjunctivae, EOM and lids are normal. No scleral icterus.  19mm  Neck: Trachea normal. No JVD present. No spinous process tenderness and no muscular tenderness present. Carotid bruit is not present. No thyromegaly present.  Cardiovascular: Normal rate, regular rhythm, normal heart sounds, intact distal pulses and normal pulses.  Respiratory: Effort normal and breath sounds normal. No respiratory distress. She exhibits no tenderness, no  bony tenderness, no laceration and no crepitus.  GI: Soft. Normal appearance. She exhibits no distension. Bowel sounds are decreased. There is no tenderness. There is no rigidity, no rebound, no guarding and no CVA tenderness.  Musculoskeletal: Normal range of motion. She exhibits no edema or tenderness.  Lymphadenopathy:    She has no cervical adenopathy.  Neurological: She is alert and oriented to person, place, and time. She has normal strength. No cranial nerve deficit or sensory deficit. GCS eye subscore is 4. GCS verbal subscore is 5. GCS motor subscore is 6.  Skin: Skin is warm, dry and intact. She is not diaphoretic.  Psychiatric: She has a normal mood and affect. Her speech is normal and behavior is normal.   EXAM: CT HEAD WITHOUT CONTRAST  CT CERVICAL SPINE WITHOUT CONTRAST  TECHNIQUE: Multidetector CT imaging of the head and cervical spine was performed following the standard protocol without intravenous contrast. Multiplanar CT image reconstructions of the cervical spine were also generated.  COMPARISON:  None.  FINDINGS: CT HEAD FINDINGS  Brain: Very large hyperdense extra-axial hemorrhage in the right hemisphere measuring up to 34 millimeters in thickness as demonstrated on series 7, image 29. The hemorrhage has a biconvex shape, although there is no definite associated skull fracture. There is also a small volume of parafalcine hemorrhage along the anterior interhemispheric fissure.  There is severe intracranial mass effect, including 24 millimeters of leftward midline shift with subfalcine herniation. There is right uncal herniation (series 7, image 31) associated with significant downward mass effect on the brainstem (sagittal image 27), and subsequent acute hemorrhage within the pons (Duret hemorrhage). There is a small volume of secondary extension into the 4th ventricle (series 3, image 10). The estimated intra-axial volume of pontine hemorrhage is 4  milliliters.  Severe effacement of the right lateral ventricle although mild dilatation of the right temporal horn. Trapped left lateral ventricle with dilated left atrium and occipital horn.  The cisterna magna and pre medullary cistern remain patent. No acute cortically based infarct identified.  Vascular: Calcified atherosclerosis at the skull base.  Skull: Osteopenia, but no definite acute skull fracture identified.  Sinuses/Orbits: Negative orbits soft tissues. Paranasal sinuses and mastoids are well pneumatized.  Other: Right lateral convexity scalp hematoma measuring up to 8 millimeters in thickness. Fluid layering in the pharynx, the patient is intubated with oral enteric tube in place.  CT CERVICAL SPINE FINDINGS  Alignment: Preserved cervical lordosis. Cervicothoracic junction alignment is within normal limits. Bilateral posterior element alignment is within normal limits.  Skull base and vertebrae: Visualized skull base is intact. No atlanto-occipital dissociation. No acute cervical spine fracture identified. Degenerative changes including subchondral cysts of the odontoid.  Soft tissues and spinal canal: No prevertebral fluid or swelling. No visible canal hematoma.  Endotracheal tube and oral enteric  tube in place and course into the trachea and esophagus respectively.  Disc levels: Disc bulging and degenerative ligament flavum hypertrophy suspected at multiple levels. No significant spinal stenosis.  Upper chest: Chest CT reported separately today. Visible upper thoracic levels appear intact. Upper lung emphysema.  IMPRESSION: 1. Very large acute right side extra-axial hemorrhage, SDH vs. EDH, with severe intracranial mass effect. Uncal and subfalcine herniation with downward mass effect on the brain and associated Duret hemorrhage in the pons. 2. Trapped left lateral ventricle and right temporal horn. Small volume of 4th intraventricular  hemorrhage extending from the brainstem. 3. No skull fracture identified.  No cervical spine fracture. 4. Critical Value/emergent results reviewed in person with Trauma Service Dr. Rosendo Gros at 1648 hours today.  CT CHEST, ABDOMEN, AND PELVIS WITH CONTRAST  TECHNIQUE: Multidetector CT imaging of the chest, abdomen and pelvis was performed following the standard protocol during bolus administration of intravenous contrast.  CONTRAST:  114mL ISOVUE-300 IOPAMIDOL (ISOVUE-300) INJECTION 61%  COMPARISON:  Head and cervical spine CT today reported separately.  FINDINGS: CT CHEST FINDINGS  Cardiovascular: Fusiform aneurysmal descending thoracic aorta with widespread atherosclerosis. The aorta measures up to 49-51 millimeters diameter from the distal arch to the diaphragmatic hiatus. Mural plaque and/or thrombus. Occasional areas of ulcerated plaque (12 millimeters in the medial descending thoracic aorta at the level of the carina on series 6, image 28). No periaortic hematoma. No thoracic aorta dissection.  No cardiomegaly. No pericardial effusion. Calcified coronary artery atherosclerosis.  Mediastinum/Nodes: No mediastinal hematoma. No lymphadenopathy. Enteric tube courses through the esophagus into the stomach.  Lungs/Pleura: Endotracheal tube in place, tip in good position between the clavicles and carina. Small volume of retained secretions at the carina and in the right mainstem bronchus. Large lung volumes with centrilobular emphysema. No pneumothorax, pleural effusion, pulmonary contusion.  Musculoskeletal: Mild to moderate T11 vertebral body compression fracture has a chronic appearance. No significant retropulsion of bone. The other thoracic vertebrae appear intact. Intact sternum. Chronic right anterolateral 3rd rib fracture. No definite acute rib fracture. No superficial chest wall soft tissue injury.  CT ABDOMEN PELVIS FINDINGS  Hepatobiliary: No liver  injury.  Negative gallbladder.  Pancreas: Negative.  Spleen: Intact and negative.  Adrenals/Urinary Tract: Right renal atrophy. Benign appearing bilateral renal cysts. Symmetric bilateral renal enhancement and contrast excretion. Adrenal glands appear intact.  Stomach/Bowel: No dilated large or small bowel. Enteric tube courses to the distal gastric body. The stomach is mildly distended with air and fluid.  No abdominal free air.  No free fluid identified.  Vascular/Lymphatic: Fusiform aneurysmal infrarenal abdominal aorta throughout that segment measuring up to 34 millimeters diameter. Bulky atherosclerosis. Patent main right renal artery stent. The major vascular structures in the abdomen and pelvis remain patent. Portal venous system is patent on the delayed images. No lymphadenopathy.  Reproductive: Diminutive or absent.  Other: No pelvic free fluid.  Musculoskeletal: Osteopenia. Lumbar spine, sacrum, and pelvis appear intact. Intact proximal left femur.  Proximal right femur intertrochanteric fracture (coronal images 38 and 39) is comminuted but only minimally displaced.  No superficial soft tissue injury identified.  IMPRESSION: 1. Positive for comminuted Intertrochanteric Right Femur Fracture. Fracture fragments are minimally displaced. Osteopenia. 2. No other acute traumatic injury identified in the chest, abdomen, or pelvis. Chronic appearing mild T11 compression fracture and right 3rd rib fracture. 3. Widespread fusiform Aortic aneurysm (ICD10-I71.9) from the descending through the infrarenal segments, up to 51 mm diameter. Bulky superimposed Aortic Atherosclerosis (ICD10-I70.0) with occasional ulcerated plaques. No aortic  dissection or rupture. 4.  Emphysema (ICD10-J43.9). 5. This study was reviewed in person with trauma surgery Dr. Rosendo Gros at 1650 hours.  Assessment/Plan: 26 F s/p Fall at home SDH vs. EDH with mass effect and uncal,  subfalcine herniation. R intratrochateric fx  Pt with large SDH vs. EDH with mass effect and uncal, subfalcine herniation. Dr. Zada Finders of [facility] notified-f/u recs. Discussed the patient's status with family at bedside.    Tina Jacks., Tina Macdonald 04/03/2018, 4:21 PM

## 2018-04-05 NOTE — ED Notes (Signed)
No pain

## 2018-04-05 NOTE — Progress Notes (Signed)
!   Son and wife here-other son not here. Tension between two brothers.  Patient  Not available to visit. Son and wife needed privacy-stressful tensions noted between 2 sons of patient.  They said they do not know the wishes of the patient for this circumstance.   Conard Novak, Chaplain   04/19/2018 1700  Clinical Encounter Type  Visited With Family (son and wife)  Visit Type ED  Referral From Nurse  Consult/Referral To Chaplain  Spiritual Encounters  Spiritual Needs Emotional  Stress Factors  Patient Stress Factors None identified  Family Stress Factors Family relationships;Health changes

## 2018-04-05 NOTE — ED Notes (Signed)
Family at the bedside.

## 2018-04-05 NOTE — Progress Notes (Signed)
Pt transported on vent to and from CT same vent settings. No further orders at this time.

## 2018-04-05 NOTE — Progress Notes (Signed)
Orthopedic Tech Progress Note Patient Details:  Tina Macdonald 1928/05/03 [id number] Made level 1 trauma visit Patient ID: Tina Macdonald, female   DOB: January 27, 1928, 84 y.o.   MRN: [id number]   [name] 04/07/2018, 4:00 PM

## 2018-04-05 NOTE — Progress Notes (Signed)
Patient transported from ED trauma b to [id number] without complication.

## 2018-04-05 NOTE — ED Provider Notes (Signed)
[facility] EMERGENCY DEPARTMENT Provider Note   CSN: [id number] Arrival date & time: 04/01/2018  1544     History   Chief Complaint Chief Complaint  Patient presents with  . Fall    HPI Tina Macdonald is a 84 y.o. female with hypertension who presents as a level 2 trauma via EMS after a fall and having altered mental status.  EMS reports that she fell at approximately 10 AM this morning and was down for approximately 1 hour.  She was reportedly fully responsive during this time but no one could hear her calling for help.  Family found her and she was supposedly awake, alert, and at her baseline.  She refused to go to the hospital at that time.  Over the course of the day, she progressively became more confused and altered. Her GCS decompensated to 6 en-route and they observed a right-sided parietal hematoma.  She was hemodynamically stable albeit hypertensive en route.  HPI  Past Medical History:  Diagnosis Date  . Hypertension     Patient Active Problem List   Diagnosis Date Noted  . Fall 03/28/2018    History reviewed. No pertinent surgical history.   OB History   None      Home Medications    Prior to Admission medications   Medication Sig Start Date End Date Taking? Authorizing Provider  ATORVASTATIN CALCIUM PO Take by mouth.   Yes [provider]  LISINOPRIL PO Take by mouth.   Yes [provider]    Family History No family history on file.  Social History Social History   Tobacco Use  . Smoking status: Current Every Day Smoker  . Smokeless tobacco: Never Used  Substance Use Topics  . Alcohol use: Never    Frequency: Never  . Drug use: Not on file     Allergies   Niacin and related   Review of Systems Review of Systems Review of Systems   Unable to obtain due to the patient's critical status and her being nonverbal/altered.      Physical Exam Updated Vital Signs BP 90/73   Pulse 70   Temp 98.3  F (36.8 C)   Resp 18   Ht _0  (1.499 m)   Wt 30.8 kg   SpO2 99%   BMI 13.73 kg/m   Physical Exam Physical Exam Constitutional  Nursing notes reviewed  Vital signs reviewed  HEENT  Obvious right parietal hematoma  Pupils 44m and minimally reactive  No other obvious trauma  Eyes move past midline with [place] testing  Respiratory  Respiratory failure  Minimal respiratory effort  CV  Normal rate  No obvious murmurs  No pitting edema  Abdomen  Soft  Non-tender  Non-distended  No peritonitis  MSK  Atraumatic  No obvious deformity  Pelvis stable  Skin  Warm  Dry  Neuro  GCS 5 (([id number]  Psychiatric  Unable to assess        ED Treatments / Results  Labs (all labs ordered are listed, but only abnormal results are displayed) Labs Reviewed  COMPREHENSIVE METABOLIC PANEL - Abnormal; Notable for the following components:      Result Value   Glucose, Bld 182 (*)    BUN 24 (*)    Creatinine, Ser 1.10 (*)    Total Protein 6.1 (*)    GFR calc non Af Amer 44 (*)    GFR calc Af Amer 51 (*)    All other components within  normal limits  CBC - Abnormal; Notable for the following components:   WBC 13.7 (*)    RBC 3.79 (*)    Hemoglobin 11.9 (*)    Platelets 113 (*)    All other components within normal limits  PROTIME-INR - Abnormal; Notable for the following components:   Prothrombin Time 15.4 (*)    All other components within normal limits  I-STAT CHEM 8, ED - Abnormal; Notable for the following components:   BUN 31 (*)    Glucose, Bld 179 (*)    Calcium, Ion 1.14 (*)    All other components within normal limits  I-STAT CG4 LACTIC ACID, ED - Abnormal; Notable for the following components:   Lactic Acid, Venous 2.94 (*)    All other components within normal limits  MRSA PCR SCREENING  CDS SEROLOGY  ETHANOL  TYPE AND SCREEN  PREPARE FRESH FROZEN PLASMA  ABO/RH    EKG None  Radiology Ct Head Wo Contrast  Result Date:  03/20/2018 CLINICAL DATA:  84 year old female status post fall earlier today with hip fracture. Subsequently became obtunded. EXAM: CT HEAD WITHOUT CONTRAST CT CERVICAL SPINE WITHOUT CONTRAST TECHNIQUE: Multidetector CT imaging of the head and cervical spine was performed following the standard protocol without intravenous contrast. Multiplanar CT image reconstructions of the cervical spine were also generated. COMPARISON:  None. FINDINGS: CT HEAD FINDINGS Brain: Very large hyperdense extra-axial hemorrhage in the right hemisphere measuring up to 34 millimeters in thickness as demonstrated on series 7, image 29. The hemorrhage has a biconvex shape, although there is no definite associated skull fracture. There is also a small volume of parafalcine hemorrhage along the anterior interhemispheric fissure. There is severe intracranial mass effect, including 24 millimeters of leftward midline shift with subfalcine herniation. There is right uncal herniation (series 7, image 31) associated with significant downward mass effect on the brainstem (sagittal image 27), and subsequent acute hemorrhage within the pons (Duret hemorrhage). There is a small volume of secondary extension into the 4th ventricle (series 3, image 10). The estimated intra-axial volume of pontine hemorrhage is 4 milliliters. Severe effacement of the right lateral ventricle although mild dilatation of the right temporal horn. Trapped left lateral ventricle with dilated left atrium and occipital horn. The cisterna magna and pre medullary cistern remain patent. No acute cortically based infarct identified. Vascular: Calcified atherosclerosis at the skull base. Skull: Osteopenia, but no definite acute skull fracture identified. Sinuses/Orbits: Negative orbits soft tissues. Paranasal sinuses and mastoids are well pneumatized. Other: Right lateral convexity scalp hematoma measuring up to 8 millimeters in thickness. Fluid layering in the pharynx, the patient is  intubated with oral enteric tube in place. CT CERVICAL SPINE FINDINGS Alignment: Preserved cervical lordosis. Cervicothoracic junction alignment is within normal limits. Bilateral posterior element alignment is within normal limits. Skull base and vertebrae: Visualized skull base is intact. No atlanto-occipital dissociation. No acute cervical spine fracture identified. Degenerative changes including subchondral cysts of the odontoid. Soft tissues and spinal canal: No prevertebral fluid or swelling. No visible canal hematoma. Endotracheal tube and oral enteric tube in place and course into the trachea and esophagus respectively. Disc levels: Disc bulging and degenerative ligament flavum hypertrophy suspected at multiple levels. No significant spinal stenosis. Upper chest: Chest CT reported separately today. Visible upper thoracic levels appear intact. Upper lung emphysema. IMPRESSION: 1. Very large acute right side extra-axial hemorrhage, SDH vs. EDH, with severe intracranial mass effect. Uncal and subfalcine herniation with downward mass effect on the brain and associated Duret  hemorrhage in the pons. 2. Trapped left lateral ventricle and right temporal horn. Small volume of 4th intraventricular hemorrhage extending from the brainstem. 3. No skull fracture identified.  No cervical spine fracture. 4. Critical Value/emergent results reviewed in person with Trauma Service Dr. Rosendo Gros at 1648 hours today. Electronically Signed   By: Genevie Ann M.D.   On: 03/25/2018 17:05   Ct Chest W Contrast  Result Date: 04/12/2018 CLINICAL DATA:  84 year old female status post fall today with hip fracture. EXAM: CT CHEST, ABDOMEN, AND PELVIS WITH CONTRAST TECHNIQUE: Multidetector CT imaging of the chest, abdomen and pelvis was performed following the standard protocol during bolus administration of intravenous contrast. CONTRAST:  150m ISOVUE-300 IOPAMIDOL (ISOVUE-300) INJECTION 61% COMPARISON:  Head and cervical spine CT today  reported separately. FINDINGS: CT CHEST FINDINGS Cardiovascular: Fusiform aneurysmal descending thoracic aorta with widespread atherosclerosis. The aorta measures up to 49-51 millimeters diameter from the distal arch to the diaphragmatic hiatus. Mural plaque and/or thrombus. Occasional areas of ulcerated plaque (12 millimeters in the medial descending thoracic aorta at the level of the carina on series 6, image 28). No periaortic hematoma. No thoracic aorta dissection. No cardiomegaly. No pericardial effusion. Calcified coronary artery atherosclerosis. Mediastinum/Nodes: No mediastinal hematoma. No lymphadenopathy. Enteric tube courses through the esophagus into the stomach. Lungs/Pleura: Endotracheal tube in place, tip in good position between the clavicles and carina. Small volume of retained secretions at the carina and in the right mainstem bronchus. Large lung volumes with centrilobular emphysema. No pneumothorax, pleural effusion, pulmonary contusion. Musculoskeletal: Mild to moderate T11 vertebral body compression fracture has a chronic appearance. No significant retropulsion of bone. The other thoracic vertebrae appear intact. Intact sternum. Chronic right anterolateral 3rd rib fracture. No definite acute rib fracture. No superficial chest wall soft tissue injury. CT ABDOMEN PELVIS FINDINGS Hepatobiliary: No liver injury.  Negative gallbladder. Pancreas: Negative. Spleen: Intact and negative. Adrenals/Urinary Tract: Right renal atrophy. Benign appearing bilateral renal cysts. Symmetric bilateral renal enhancement and contrast excretion. Adrenal glands appear intact. Stomach/Bowel: No dilated large or small bowel. Enteric tube courses to the distal gastric body. The stomach is mildly distended with air and fluid. No abdominal free air.  No free fluid identified. Vascular/Lymphatic: Fusiform aneurysmal infrarenal abdominal aorta throughout that segment measuring up to 34 millimeters diameter. Bulky  atherosclerosis. Patent main right renal artery stent. The major vascular structures in the abdomen and pelvis remain patent. Portal venous system is patent on the delayed images. No lymphadenopathy. Reproductive: Diminutive or absent. Other: No pelvic free fluid. Musculoskeletal: Osteopenia. Lumbar spine, sacrum, and pelvis appear intact. Intact proximal left femur. Proximal right femur intertrochanteric fracture (coronal images 38 and 39) is comminuted but only minimally displaced. No superficial soft tissue injury identified. IMPRESSION: 1. Positive for comminuted Intertrochanteric Right Femur Fracture. Fracture fragments are minimally displaced. Osteopenia. 2. No other acute traumatic injury identified in the chest, abdomen, or pelvis. Chronic appearing mild T11 compression fracture and right 3rd rib fracture. 3. Widespread fusiform Aortic aneurysm (ICD10-I71.9) from the descending through the infrarenal segments, up to 51 mm diameter. Bulky superimposed Aortic Atherosclerosis (ICD10-I70.0) with occasional ulcerated plaques. No aortic dissection or rupture. 4.  Emphysema (ICD10-J43.9). 5. This study was reviewed in person with trauma surgery Dr. RRosendo Grosat 1650 hours. Electronically Signed   By: HGenevie AnnM.D.   On: 03/27/2018 17:22   Ct Cervical Spine Wo Contrast  Result Date: 04/15/2018 CLINICAL DATA:  84year old female status post fall earlier today with hip fracture. Subsequently became obtunded. EXAM: CT  HEAD WITHOUT CONTRAST CT CERVICAL SPINE WITHOUT CONTRAST TECHNIQUE: Multidetector CT imaging of the head and cervical spine was performed following the standard protocol without intravenous contrast. Multiplanar CT image reconstructions of the cervical spine were also generated. COMPARISON:  None. FINDINGS: CT HEAD FINDINGS Brain: Very large hyperdense extra-axial hemorrhage in the right hemisphere measuring up to 34 millimeters in thickness as demonstrated on series 7, image 29. The hemorrhage has a  biconvex shape, although there is no definite associated skull fracture. There is also a small volume of parafalcine hemorrhage along the anterior interhemispheric fissure. There is severe intracranial mass effect, including 24 millimeters of leftward midline shift with subfalcine herniation. There is right uncal herniation (series 7, image 31) associated with significant downward mass effect on the brainstem (sagittal image 27), and subsequent acute hemorrhage within the pons (Duret hemorrhage). There is a small volume of secondary extension into the 4th ventricle (series 3, image 10). The estimated intra-axial volume of pontine hemorrhage is 4 milliliters. Severe effacement of the right lateral ventricle although mild dilatation of the right temporal horn. Trapped left lateral ventricle with dilated left atrium and occipital horn. The cisterna magna and pre medullary cistern remain patent. No acute cortically based infarct identified. Vascular: Calcified atherosclerosis at the skull base. Skull: Osteopenia, but no definite acute skull fracture identified. Sinuses/Orbits: Negative orbits soft tissues. Paranasal sinuses and mastoids are well pneumatized. Other: Right lateral convexity scalp hematoma measuring up to 8 millimeters in thickness. Fluid layering in the pharynx, the patient is intubated with oral enteric tube in place. CT CERVICAL SPINE FINDINGS Alignment: Preserved cervical lordosis. Cervicothoracic junction alignment is within normal limits. Bilateral posterior element alignment is within normal limits. Skull base and vertebrae: Visualized skull base is intact. No atlanto-occipital dissociation. No acute cervical spine fracture identified. Degenerative changes including subchondral cysts of the odontoid. Soft tissues and spinal canal: No prevertebral fluid or swelling. No visible canal hematoma. Endotracheal tube and oral enteric tube in place and course into the trachea and esophagus respectively. Disc  levels: Disc bulging and degenerative ligament flavum hypertrophy suspected at multiple levels. No significant spinal stenosis. Upper chest: Chest CT reported separately today. Visible upper thoracic levels appear intact. Upper lung emphysema. IMPRESSION: 1. Very large acute right side extra-axial hemorrhage, SDH vs. EDH, with severe intracranial mass effect. Uncal and subfalcine herniation with downward mass effect on the brain and associated Duret hemorrhage in the pons. 2. Trapped left lateral ventricle and right temporal horn. Small volume of 4th intraventricular hemorrhage extending from the brainstem. 3. No skull fracture identified.  No cervical spine fracture. 4. Critical Value/emergent results reviewed in person with Trauma Service Dr. Rosendo Gros at 1648 hours today. Electronically Signed   By: Genevie Ann M.D.   On: 03/27/2018 17:05   Ct Abdomen Pelvis W Contrast  Result Date: 04/13/2018 CLINICAL DATA:  84 year old female status post fall today with hip fracture. EXAM: CT CHEST, ABDOMEN, AND PELVIS WITH CONTRAST TECHNIQUE: Multidetector CT imaging of the chest, abdomen and pelvis was performed following the standard protocol during bolus administration of intravenous contrast. CONTRAST:  11m ISOVUE-300 IOPAMIDOL (ISOVUE-300) INJECTION 61% COMPARISON:  Head and cervical spine CT today reported separately. FINDINGS: CT CHEST FINDINGS Cardiovascular: Fusiform aneurysmal descending thoracic aorta with widespread atherosclerosis. The aorta measures up to 49-51 millimeters diameter from the distal arch to the diaphragmatic hiatus. Mural plaque and/or thrombus. Occasional areas of ulcerated plaque (12 millimeters in the medial descending thoracic aorta at the level of the carina on series 6,  image 28). No periaortic hematoma. No thoracic aorta dissection. No cardiomegaly. No pericardial effusion. Calcified coronary artery atherosclerosis. Mediastinum/Nodes: No mediastinal hematoma. No lymphadenopathy. Enteric tube  courses through the esophagus into the stomach. Lungs/Pleura: Endotracheal tube in place, tip in good position between the clavicles and carina. Small volume of retained secretions at the carina and in the right mainstem bronchus. Large lung volumes with centrilobular emphysema. No pneumothorax, pleural effusion, pulmonary contusion. Musculoskeletal: Mild to moderate T11 vertebral body compression fracture has a chronic appearance. No significant retropulsion of bone. The other thoracic vertebrae appear intact. Intact sternum. Chronic right anterolateral 3rd rib fracture. No definite acute rib fracture. No superficial chest wall soft tissue injury. CT ABDOMEN PELVIS FINDINGS Hepatobiliary: No liver injury.  Negative gallbladder. Pancreas: Negative. Spleen: Intact and negative. Adrenals/Urinary Tract: Right renal atrophy. Benign appearing bilateral renal cysts. Symmetric bilateral renal enhancement and contrast excretion. Adrenal glands appear intact. Stomach/Bowel: No dilated large or small bowel. Enteric tube courses to the distal gastric body. The stomach is mildly distended with air and fluid. No abdominal free air.  No free fluid identified. Vascular/Lymphatic: Fusiform aneurysmal infrarenal abdominal aorta throughout that segment measuring up to 34 millimeters diameter. Bulky atherosclerosis. Patent main right renal artery stent. The major vascular structures in the abdomen and pelvis remain patent. Portal venous system is patent on the delayed images. No lymphadenopathy. Reproductive: Diminutive or absent. Other: No pelvic free fluid. Musculoskeletal: Osteopenia. Lumbar spine, sacrum, and pelvis appear intact. Intact proximal left femur. Proximal right femur intertrochanteric fracture (coronal images 38 and 39) is comminuted but only minimally displaced. No superficial soft tissue injury identified. IMPRESSION: 1. Positive for comminuted Intertrochanteric Right Femur Fracture. Fracture fragments are minimally  displaced. Osteopenia. 2. No other acute traumatic injury identified in the chest, abdomen, or pelvis. Chronic appearing mild T11 compression fracture and right 3rd rib fracture. 3. Widespread fusiform Aortic aneurysm (ICD10-I71.9) from the descending through the infrarenal segments, up to 51 mm diameter. Bulky superimposed Aortic Atherosclerosis (ICD10-I70.0) with occasional ulcerated plaques. No aortic dissection or rupture. 4.  Emphysema (ICD10-J43.9). 5. This study was reviewed in person with trauma surgery Dr. Rosendo Gros at 1650 hours. Electronically Signed   By: Genevie Ann M.D.   On: 04/12/2018 17:22   Dg Pelvis Portable  Result Date: 04/03/2018 CLINICAL DATA:  Fall. EXAM: PORTABLE PELVIS 1-2 VIEWS COMPARISON:  None. FINDINGS: An intertrochanteric fracture of the RIGHT femur is noted with varus angulation. No dislocation identified. Diffuse osteopenia is present. No other acute bony abnormalities are noted. IMPRESSION: Intertrochanteric RIGHT femur fracture. Electronically Signed   By: Margarette Canada M.D.   On: 03/27/2018 17:01   Dg Chest Port 1 View  Result Date: 03/27/2018 CLINICAL DATA:  Fall.  Patient intubated. EXAM: PORTABLE CHEST 1 VIEW COMPARISON:  None. FINDINGS: Cardiomegaly with tortuous and probable aneurysmal dilatation of the thoracic aorta noted. An endotracheal tube is identified with tip 3 cm above the carina. An NG tube entering stomach. There is no evidence of focal airspace disease, pulmonary edema, suspicious pulmonary nodule/mass, pleural effusion, or pneumothorax. No acute bony abnormalities are identified. IMPRESSION: Cardiomegaly with tortuous/probable aneurysmal thoracic aorta. No acute abnormality otherwise. Support apparatus as described. Electronically Signed   By: Margarette Canada M.D.   On: 03/20/2018 17:00    Procedures Procedure Name: Intubation Date/Time: 03/23/2018 11:31 PM Performed by: Alford Highland, MD Pre-anesthesia Checklist: Suction available, Patient being monitored,  Emergency Drugs available, Timeout performed and Patient identified Oxygen Delivery Method: Ambu bag Preoxygenation: Pre-oxygenation with 100% oxygen  Induction Type: Rapid sequence Laryngoscope Size: Glidescope and 3 Grade View: Grade I Tube size: 7.0 mm Number of attempts: 2 Airway Equipment and Method: Video-laryngoscopy and Bougie stylet Placement Confirmation: ETT inserted through vocal cords under direct vision,  CO2 detector and Breath sounds checked- equal and bilateral Difficulty Due To: Difficulty was unanticipated and Difficult Airway- due to anterior larynx Future Recommendations: Recommend- induction with short-acting agent, and alternative techniques readily available Comments: She was a very difficult intubation, being both anterior and having significant difficulty passing the ET tube despite having excellent visualization.  2 attempts at intubation were made, with the second being successful utilizing a bougie and BURP and smaller (7.0) ETT.      (including critical care time)  Medications Ordered in ED Medications  0.9 %  sodium chloride infusion ( Intravenous Rate/Dose Verify 04/11/2018 2200)  propofol (DIPRIVAN) 1000 MG/100ML infusion (has no administration in time range)  chlorhexidine gluconate (MEDLINE KIT) (PERIDEX) 0.12 % solution 15 mL (15 mLs Mouth Rinse Given 04/03/2018 2106)  MEDLINE mouth rinse (has no administration in time range)  iopamidol (ISOVUE-300) 61 % injection 100 mL (100 mLs Intravenous Contrast Given 04/14/2018 1633)     Initial Impression / Assessment and Plan / ED Course  I have reviewed the triage vital signs and the nursing notes.  Pertinent labs & imaging results that were available during my care of the patient were reviewed by me and considered in my medical decision making (see chart for details).  Clinical Course as of Apr 05 2316  Sat Apr 05, 2018  1627 Tina Macdonald is a critically ill patient who presents via EMS after a fall.  She  became obtunded and had a decrease in her mental status during transport.Currently, she is minimally responsive and has flexor posturing with pain.  She is not maintaining her airway.  She was intubated with video laryngoscopy using rocuronium and etomidate.  She was a very difficult intubation, being both anterior and having significant difficulty passing the ET tube despite having excellent visualization.  2 attempts at intubation were made, with the second being successful utilizing a bougie and BURP and smaller (7.0) ETT.  She is tachycardic and profoundly hypertensive.  Chest x-ray reveals her known aortic aneurysm but no evidence of pneumothorax.  She appears to have a right femur fracture on plain films.  Trauma surgery is at bedside.  She was immediately taken to the CT scanner for full trauma imaging.I updated the family in the conference room.  They are discussing her wishes and code status.   [NA]  1650 Neurosurgery paged.     [NA]  1846 She was given 3% normal saline for possible cerebral edema and herniation.  Imaging revealed an extensive brain hemorrhage and right femur fracture.  I discussed her care with the chaplain, trauma surgery, and neurosurgery.  Her injuries are nonsurvivable.  I updated the family.  She is now a DNR.  We will plan for admission to the intensive care unit while additional family arrives into town.   [NA]  She was admitted to the trauma service for comfort care measures.  Clinical Course User Index [NA] Alford Highland, MD     Final Clinical Impressions(s) / ED Diagnoses   Final diagnoses:  Fall, initial encounter  Epidural hematoma [facility])    ED Discharge Orders    None       Alford Highland, MD 04/07/2018 7510    Elnora Morrison, MD 04/28/2018 1539

## 2018-04-05 NOTE — ED Notes (Signed)
etomidaTE20 MG AT 0175 ROCH 80MG  IV  BOTH BY BRENDA RN  1556

## 2018-04-05 NOTE — ED Triage Notes (Signed)
The pt arrived by [facility] ems from home where the pt lives with her hidband  She fell last week and again today  Family checking on her found her in a recliner  She was c/o a headache and started vomiting when they arrived.  She talked to the paramedics and stopped talking on the way here becoming unresponsive  Some rt arm and rt leg movement on arrival she was being bagged  Intubated by edp   Iv per ems

## 2018-04-05 NOTE — ED Notes (Signed)
Returned from c-t  pts condition remains unchanged  No sedation is being used thus far.. Pupils  approx 3.0  Equal  ?? React sluggishly.  Bi-lateral pulses dopplered bounding

## 2018-04-05 NOTE — ED Notes (Signed)
bp dropping again  hypertonis saline going

## 2018-04-05 NOTE — ED Notes (Signed)
Dr Rosendo Gros here to see the pt again  Fluid bolus 546ml ordered

## 2018-04-05 NOTE — ED Notes (Signed)
waitinhg on other family  pts pupils 4-5 sluggish sl reaction  bp continues to  decrease

## 2018-04-05 NOTE — ED Notes (Signed)
Son at the bedside waiting on neuro surgery.  pts pupils are 4.0 and sluggish to react

## 2018-04-05 NOTE — ED Notes (Signed)
232/138 

## 2018-04-05 NOTE — ED Triage Notes (Signed)
The family reports that she still drives around recently bolught her a ndw car goes out to eat with friends 1-2 times a week  Hx of a triple a thaT has been stable  Her husband that she lives with has dementia

## 2018-04-05 NOTE — Consult Note (Signed)
Neurosurgery Consultation  Reason for Consult: Subdural hematoma Referring Physician: Dr. Reather Converse  CC: fall  HPI: This is a 84 y.o. woman that presents after a fall. She had progressive decrease in mental status requiring intubation. Due to her grave neurologic status, no further history information is available from the patient. Other injuries reportedly include a hip fracture. Last paralytic and sedation was rocuronium and etomidate 20mg  at 15:56. She went to CT where a large hemorrhage was diagnosed and neurosurgery was consulted regarding her care. It is unknown if she has had recent use of anti-platelet or anti-coagulant medications but her outpatient medication reconciliation does not contain any.   ROS: A 14 point ROS was performed and is negative except as noted in the HPI.   PMHx:  Past Medical History:  Diagnosis Date  . Hypertension    FamHx: No family history on file. SocHx:   reports that she has been smoking. She has never used smokeless tobacco. She reports that she does not drink alcohol. Her drug history is not on file.  Exam: Vital signs in last 24 hours: Temp:  [98.3 F (36.8 C)] 98.3 F (36.8 C) (08/17 1558) Pulse Rate:  [55-136] 71 (08/17 1813) Resp:  [18] 18 (08/17 1710) BP: (99-257)/(70-183) 99/72 (08/17 1813) SpO2:  [99 %-100 %] 100 % (08/17 1813) FiO2 (%):  [100 %] 100 % (08/17 1610) Weight:  [30.8 kg] 30.8 kg (08/17 1741) General: lying in bed in a trauma room, appears gravely ill Head: normocephalic HEENT: hard cervical collar in place Pulmonary: intubated with ET tube in place connected to ventilator with chest rise bilaterally Cardiac: RRR Abdomen: S NT ND Extremities: warm and well perfused x4 Neuro: Eyes closed to noxious stimulation, pupils fixed and dilated OU, no corneals, no cough, no gag, minimal flexion with peripheral painful stimulation, toes upgoing bilaterally  Assessment and Plan: 84 y.o. woman s/p fall with progressive decline in  neurologic function, now with no brainstem reflexes with sedation held. CT head personally reviewed, which shows a very large right acute subdural hematoma with midline shift and brain compression, duret hemorrhages present in the brainstem likely due to herniation. -no acute neurosurgical intervention indicated at this time, no brainstem reflexes on exam and duret hemorrhages on CT, non-survivable injury -discussed above with patient's family and explained the natural history of this disease, questions answered -please call with any concerns or questions  Judith Part, MD 03/28/2018  6:13 PM [facility]

## 2018-04-05 NOTE — ED Notes (Signed)
t 98.3

## 2018-04-05 NOTE — ED Notes (Signed)
Af on the monitor

## 2018-04-05 NOTE — ED Notes (Signed)
Still attempting intubation  Bagging other wise

## 2018-04-07 ENCOUNTER — Encounter: Payer: Self-pay | Admitting: Vascular Surgery

## 2018-04-20 NOTE — Progress Notes (Signed)
RN and RT at bedside to terminally extubate patient. Patient is an ME case and the ME was called. RN spoke with Wynona Canes and was told that it was okay to remove ETT.   Family at bedside with patient now.

## 2018-04-20 NOTE — Progress Notes (Signed)
RT called to bedside to terminally extubate patient. Extubated pt to RA per order MD Rosendo Gros and family wishes. ME case and  ME Wynona Canes aware and OK'd tube removed.

## 2018-04-20 NOTE — Progress Notes (Signed)
Per CDS patient not a candidate for organ donation d/t age. Referral number: [id number].

## 2018-04-20 NOTE — Progress Notes (Signed)
This RN and Phebe Colla, RN pronounced cardiac death at 100. Family at bedside with patient.

## 2018-04-20 NOTE — Death Summary Note (Signed)
DEATH SUMMARY   Patient Details  Name: Tina Macdonald MRN: [id number] DOB: 16-May-1928  Admission/Discharge Information   Admit Date:  04/01/2018  Date of Death: Date of Death: 04-07-18  Time of Death: Time of Death: 0147  Length of Stay: 0  Referring Physician: Patient, No Pcp Per   Reason(s) for Hospitalization  Fall, subdural hematoma, right hip fracture  Diagnoses  Preliminary cause of death:  Secondary Diagnoses (including complications and co-morbidities):  Active Problems:   [facility] Course (including significant findings, care, treatment, and services provided and events leading to death)  Tina Macdonald is a 84 y.o. year old female who reportedly had a fall at 10 AM.  Patient was being brought to the ER for further evaluation and approximately 3 PM secondary to her fall.  In route the patient had mental status change and required escalation of care.  On arrival patient had a declining GCS status.  Patient was intubated on arrival.  She arrived as a level 2 trauma however was upgraded to level 1 secondary to her mental status decline.  Patient work-up to include CT scan.  CT scan revealed a large subdural versus epidural hematoma.  Patient also had signs of brainstem herniation.  Neurosurgery was consulted and deemed this was a nonsurvivable situation.  Patient's family was notified and spoken with.  Patient was made DNR and comfort care.  Patient was admitted to the ICU on the ventilator to allow for family to visit.  The patient was then terminally extubated per the patient's family wishes.  Patient was pronounced at 09/16/2045, and DC'd to the morgue.    Pertinent Labs and Studies  Significant Diagnostic Studies Ct Head Wo Contrast  Result Date: April 06, 2018 CLINICAL DATA:  84 year old female status post fall earlier today with hip fracture. Subsequently became obtunded. EXAM: CT HEAD WITHOUT CONTRAST CT CERVICAL SPINE WITHOUT CONTRAST TECHNIQUE:  Multidetector CT imaging of the head and cervical spine was performed following the standard protocol without intravenous contrast. Multiplanar CT image reconstructions of the cervical spine were also generated. COMPARISON:  None. FINDINGS: CT HEAD FINDINGS Brain: Very large hyperdense extra-axial hemorrhage in the right hemisphere measuring up to 34 millimeters in thickness as demonstrated on series 7, image 29. The hemorrhage has a biconvex shape, although there is no definite associated skull fracture. There is also a small volume of parafalcine hemorrhage along the anterior interhemispheric fissure. There is severe intracranial mass effect, including 24 millimeters of leftward midline shift with subfalcine herniation. There is right uncal herniation (series 7, image 31) associated with significant downward mass effect on the brainstem (sagittal image 27), and subsequent acute hemorrhage within the pons (Duret hemorrhage). There is a small volume of secondary extension into the 4th ventricle (series 3, image 10). The estimated intra-axial volume of pontine hemorrhage is 4 milliliters. Severe effacement of the right lateral ventricle although mild dilatation of the right temporal horn. Trapped left lateral ventricle with dilated left atrium and occipital horn. The cisterna magna and pre medullary cistern remain patent. No acute cortically based infarct identified. Vascular: Calcified atherosclerosis at the skull base. Skull: Osteopenia, but no definite acute skull fracture identified. Sinuses/Orbits: Negative orbits soft tissues. Paranasal sinuses and mastoids are well pneumatized. Other: Right lateral convexity scalp hematoma measuring up to 8 millimeters in thickness. Fluid layering in the pharynx, the patient is intubated with oral enteric tube in place. CT CERVICAL SPINE FINDINGS Alignment: Preserved cervical lordosis. Cervicothoracic junction alignment is within normal limits.  Bilateral posterior element  alignment is within normal limits. Skull base and vertebrae: Visualized skull base is intact. No atlanto-occipital dissociation. No acute cervical spine fracture identified. Degenerative changes including subchondral cysts of the odontoid. Soft tissues and spinal canal: No prevertebral fluid or swelling. No visible canal hematoma. Endotracheal tube and oral enteric tube in place and course into the trachea and esophagus respectively. Disc levels: Disc bulging and degenerative ligament flavum hypertrophy suspected at multiple levels. No significant spinal stenosis. Upper chest: Chest CT reported separately today. Visible upper thoracic levels appear intact. Upper lung emphysema. IMPRESSION: 1. Very large acute right side extra-axial hemorrhage, SDH vs. EDH, with severe intracranial mass effect. Uncal and subfalcine herniation with downward mass effect on the brain and associated Duret hemorrhage in the pons. 2. Trapped left lateral ventricle and right temporal horn. Small volume of 4th intraventricular hemorrhage extending from the brainstem. 3. No skull fracture identified.  No cervical spine fracture. 4. Critical Value/emergent results reviewed in person with Trauma Service Dr. Rosendo Gros at 1648 hours today. Electronically Signed   By: Genevie Ann M.D.   On: 04/17/2018 17:05   Ct Chest W Contrast  Result Date: 03/26/2018 CLINICAL DATA:  84 year old female status post fall today with hip fracture. EXAM: CT CHEST, ABDOMEN, AND PELVIS WITH CONTRAST TECHNIQUE: Multidetector CT imaging of the chest, abdomen and pelvis was performed following the standard protocol during bolus administration of intravenous contrast. CONTRAST:  121mL ISOVUE-300 IOPAMIDOL (ISOVUE-300) INJECTION 61% COMPARISON:  Head and cervical spine CT today reported separately. FINDINGS: CT CHEST FINDINGS Cardiovascular: Fusiform aneurysmal descending thoracic aorta with widespread atherosclerosis. The aorta measures up to 49-51 millimeters diameter from  the distal arch to the diaphragmatic hiatus. Mural plaque and/or thrombus. Occasional areas of ulcerated plaque (12 millimeters in the medial descending thoracic aorta at the level of the carina on series 6, image 28). No periaortic hematoma. No thoracic aorta dissection. No cardiomegaly. No pericardial effusion. Calcified coronary artery atherosclerosis. Mediastinum/Nodes: No mediastinal hematoma. No lymphadenopathy. Enteric tube courses through the esophagus into the stomach. Lungs/Pleura: Endotracheal tube in place, tip in good position between the clavicles and carina. Small volume of retained secretions at the carina and in the right mainstem bronchus. Large lung volumes with centrilobular emphysema. No pneumothorax, pleural effusion, pulmonary contusion. Musculoskeletal: Mild to moderate T11 vertebral body compression fracture has a chronic appearance. No significant retropulsion of bone. The other thoracic vertebrae appear intact. Intact sternum. Chronic right anterolateral 3rd rib fracture. No definite acute rib fracture. No superficial chest wall soft tissue injury. CT ABDOMEN PELVIS FINDINGS Hepatobiliary: No liver injury.  Negative gallbladder. Pancreas: Negative. Spleen: Intact and negative. Adrenals/Urinary Tract: Right renal atrophy. Benign appearing bilateral renal cysts. Symmetric bilateral renal enhancement and contrast excretion. Adrenal glands appear intact. Stomach/Bowel: No dilated large or small bowel. Enteric tube courses to the distal gastric body. The stomach is mildly distended with air and fluid. No abdominal free air.  No free fluid identified. Vascular/Lymphatic: Fusiform aneurysmal infrarenal abdominal aorta throughout that segment measuring up to 34 millimeters diameter. Bulky atherosclerosis. Patent main right renal artery stent. The major vascular structures in the abdomen and pelvis remain patent. Portal venous system is patent on the delayed images. No lymphadenopathy.  Reproductive: Diminutive or absent. Other: No pelvic free fluid. Musculoskeletal: Osteopenia. Lumbar spine, sacrum, and pelvis appear intact. Intact proximal left femur. Proximal right femur intertrochanteric fracture (coronal images 38 and 39) is comminuted but only minimally displaced. No superficial soft tissue injury identified. IMPRESSION: 1. Positive for  comminuted Intertrochanteric Right Femur Fracture. Fracture fragments are minimally displaced. Osteopenia. 2. No other acute traumatic injury identified in the chest, abdomen, or pelvis. Chronic appearing mild T11 compression fracture and right 3rd rib fracture. 3. Widespread fusiform Aortic aneurysm (ICD10-I71.9) from the descending through the infrarenal segments, up to 51 mm diameter. Bulky superimposed Aortic Atherosclerosis (ICD10-I70.0) with occasional ulcerated plaques. No aortic dissection or rupture. 4.  Emphysema (ICD10-J43.9). 5. This study was reviewed in person with trauma surgery Dr. Rosendo Gros at 1650 hours. Electronically Signed   By: Genevie Ann M.D.   On: 03/21/2018 17:22   Ct Cervical Spine Wo Contrast  Result Date: 04/04/2018 CLINICAL DATA:  84 year old female status post fall earlier today with hip fracture. Subsequently became obtunded. EXAM: CT HEAD WITHOUT CONTRAST CT CERVICAL SPINE WITHOUT CONTRAST TECHNIQUE: Multidetector CT imaging of the head and cervical spine was performed following the standard protocol without intravenous contrast. Multiplanar CT image reconstructions of the cervical spine were also generated. COMPARISON:  None. FINDINGS: CT HEAD FINDINGS Brain: Very large hyperdense extra-axial hemorrhage in the right hemisphere measuring up to 34 millimeters in thickness as demonstrated on series 7, image 29. The hemorrhage has a biconvex shape, although there is no definite associated skull fracture. There is also a small volume of parafalcine hemorrhage along the anterior interhemispheric fissure. There is severe intracranial  mass effect, including 24 millimeters of leftward midline shift with subfalcine herniation. There is right uncal herniation (series 7, image 31) associated with significant downward mass effect on the brainstem (sagittal image 27), and subsequent acute hemorrhage within the pons (Duret hemorrhage). There is a small volume of secondary extension into the 4th ventricle (series 3, image 10). The estimated intra-axial volume of pontine hemorrhage is 4 milliliters. Severe effacement of the right lateral ventricle although mild dilatation of the right temporal horn. Trapped left lateral ventricle with dilated left atrium and occipital horn. The cisterna magna and pre medullary cistern remain patent. No acute cortically based infarct identified. Vascular: Calcified atherosclerosis at the skull base. Skull: Osteopenia, but no definite acute skull fracture identified. Sinuses/Orbits: Negative orbits soft tissues. Paranasal sinuses and mastoids are well pneumatized. Other: Right lateral convexity scalp hematoma measuring up to 8 millimeters in thickness. Fluid layering in the pharynx, the patient is intubated with oral enteric tube in place. CT CERVICAL SPINE FINDINGS Alignment: Preserved cervical lordosis. Cervicothoracic junction alignment is within normal limits. Bilateral posterior element alignment is within normal limits. Skull base and vertebrae: Visualized skull base is intact. No atlanto-occipital dissociation. No acute cervical spine fracture identified. Degenerative changes including subchondral cysts of the odontoid. Soft tissues and spinal canal: No prevertebral fluid or swelling. No visible canal hematoma. Endotracheal tube and oral enteric tube in place and course into the trachea and esophagus respectively. Disc levels: Disc bulging and degenerative ligament flavum hypertrophy suspected at multiple levels. No significant spinal stenosis. Upper chest: Chest CT reported separately today. Visible upper thoracic  levels appear intact. Upper lung emphysema. IMPRESSION: 1. Very large acute right side extra-axial hemorrhage, SDH vs. EDH, with severe intracranial mass effect. Uncal and subfalcine herniation with downward mass effect on the brain and associated Duret hemorrhage in the pons. 2. Trapped left lateral ventricle and right temporal horn. Small volume of 4th intraventricular hemorrhage extending from the brainstem. 3. No skull fracture identified.  No cervical spine fracture. 4. Critical Value/emergent results reviewed in person with Trauma Service Dr. Rosendo Gros at 1648 hours today. Electronically Signed   By: Herminio Heads.D.  On: 03/23/2018 17:05   Ct Abdomen Pelvis W Contrast  Result Date: 03/21/2018 CLINICAL DATA:  84 year old female status post fall today with hip fracture. EXAM: CT CHEST, ABDOMEN, AND PELVIS WITH CONTRAST TECHNIQUE: Multidetector CT imaging of the chest, abdomen and pelvis was performed following the standard protocol during bolus administration of intravenous contrast. CONTRAST:  16mL ISOVUE-300 IOPAMIDOL (ISOVUE-300) INJECTION 61% COMPARISON:  Head and cervical spine CT today reported separately. FINDINGS: CT CHEST FINDINGS Cardiovascular: Fusiform aneurysmal descending thoracic aorta with widespread atherosclerosis. The aorta measures up to 49-51 millimeters diameter from the distal arch to the diaphragmatic hiatus. Mural plaque and/or thrombus. Occasional areas of ulcerated plaque (12 millimeters in the medial descending thoracic aorta at the level of the carina on series 6, image 28). No periaortic hematoma. No thoracic aorta dissection. No cardiomegaly. No pericardial effusion. Calcified coronary artery atherosclerosis. Mediastinum/Nodes: No mediastinal hematoma. No lymphadenopathy. Enteric tube courses through the esophagus into the stomach. Lungs/Pleura: Endotracheal tube in place, tip in good position between the clavicles and carina. Small volume of retained secretions at the carina and  in the right mainstem bronchus. Large lung volumes with centrilobular emphysema. No pneumothorax, pleural effusion, pulmonary contusion. Musculoskeletal: Mild to moderate T11 vertebral body compression fracture has a chronic appearance. No significant retropulsion of bone. The other thoracic vertebrae appear intact. Intact sternum. Chronic right anterolateral 3rd rib fracture. No definite acute rib fracture. No superficial chest wall soft tissue injury. CT ABDOMEN PELVIS FINDINGS Hepatobiliary: No liver injury.  Negative gallbladder. Pancreas: Negative. Spleen: Intact and negative. Adrenals/Urinary Tract: Right renal atrophy. Benign appearing bilateral renal cysts. Symmetric bilateral renal enhancement and contrast excretion. Adrenal glands appear intact. Stomach/Bowel: No dilated large or small bowel. Enteric tube courses to the distal gastric body. The stomach is mildly distended with air and fluid. No abdominal free air.  No free fluid identified. Vascular/Lymphatic: Fusiform aneurysmal infrarenal abdominal aorta throughout that segment measuring up to 34 millimeters diameter. Bulky atherosclerosis. Patent main right renal artery stent. The major vascular structures in the abdomen and pelvis remain patent. Portal venous system is patent on the delayed images. No lymphadenopathy. Reproductive: Diminutive or absent. Other: No pelvic free fluid. Musculoskeletal: Osteopenia. Lumbar spine, sacrum, and pelvis appear intact. Intact proximal left femur. Proximal right femur intertrochanteric fracture (coronal images 38 and 39) is comminuted but only minimally displaced. No superficial soft tissue injury identified. IMPRESSION: 1. Positive for comminuted Intertrochanteric Right Femur Fracture. Fracture fragments are minimally displaced. Osteopenia. 2. No other acute traumatic injury identified in the chest, abdomen, or pelvis. Chronic appearing mild T11 compression fracture and right 3rd rib fracture. 3. Widespread  fusiform Aortic aneurysm (ICD10-I71.9) from the descending through the infrarenal segments, up to 51 mm diameter. Bulky superimposed Aortic Atherosclerosis (ICD10-I70.0) with occasional ulcerated plaques. No aortic dissection or rupture. 4.  Emphysema (ICD10-J43.9). 5. This study was reviewed in person with trauma surgery Dr. Rosendo Gros at 1650 hours. Electronically Signed   By: Genevie Ann M.D.   On: 03/28/2018 17:22   Dg Pelvis Portable  Result Date: 04/04/2018 CLINICAL DATA:  Fall. EXAM: PORTABLE PELVIS 1-2 VIEWS COMPARISON:  None. FINDINGS: An intertrochanteric fracture of the RIGHT femur is noted with varus angulation. No dislocation identified. Diffuse osteopenia is present. No other acute bony abnormalities are noted. IMPRESSION: Intertrochanteric RIGHT femur fracture. Electronically Signed   By: Margarette Canada M.D.   On: 03/26/2018 17:01   Dg Chest Port 1 View  Result Date: 04/04/2018 CLINICAL DATA:  Fall.  Patient intubated. EXAM: PORTABLE CHEST  1 VIEW COMPARISON:  None. FINDINGS: Cardiomegaly with tortuous and probable aneurysmal dilatation of the thoracic aorta noted. An endotracheal tube is identified with tip 3 cm above the carina. An NG tube entering stomach. There is no evidence of focal airspace disease, pulmonary edema, suspicious pulmonary nodule/mass, pleural effusion, or pneumothorax. No acute bony abnormalities are identified. IMPRESSION: Cardiomegaly with tortuous/probable aneurysmal thoracic aorta. No acute abnormality otherwise. Support apparatus as described. Electronically Signed   By: Margarette Canada M.D.   On: 04/03/2018 17:00    Microbiology Recent Results (from the past 240 hour(s))  MRSA PCR Screening     Status: None   Collection Time: 04/08/2018  8:27 PM  Result Value Ref Range Status   MRSA by PCR NEGATIVE NEGATIVE Final    Comment:        The GeneXpert MRSA Assay (FDA approved for NASAL specimens only), is one component of a comprehensive MRSA colonization surveillance  program. It is not intended to diagnose MRSA infection nor to guide or monitor treatment for MRSA infections. Performed at [facility], [place] [address], [place],  [address]     Lab Basic Metabolic Panel: Recent Labs  Lab 03/30/2018 1606 04/13/2018 1622  NA 139 141  K 3.5 3.5  CL 102 101  CO2 25  --   GLUCOSE 182* 179*  BUN 24* 31*  CREATININE 1.10* 1.00  CALCIUM 9.1  --    Liver Function Tests: Recent Labs  Lab 04/07/2018 1606  AST 33  ALT 21  ALKPHOS 66  BILITOT 1.1  PROT 6.1*  ALBUMIN 3.5   No results for input(s): LIPASE, AMYLASE in the last 168 hours. No results for input(s): AMMONIA in the last 168 hours. CBC: Recent Labs  Lab 04/03/2018 1606 03/24/2018 1622  WBC 13.7*  --   HGB 11.9* 12.2  HCT 37.1 36.0  MCV 97.9  --   PLT 113*  --    Cardiac Enzymes: No results for input(s): CKTOTAL, CKMB, CKMBINDEX, TROPONINI in the last 168 hours. Sepsis Labs: Recent Labs  Lab 04/08/2018 1606 04/15/2018 1622  WBC 13.7*  --   LATICACIDVEN  --  2.94*    Procedures/Operations  None   Rosario Jacks., Kentavious Michele April 13, 2018, 6:41 AM

## 2018-04-20 NOTE — Progress Notes (Signed)
Family has requested to extubate the patient. Dr. Rosendo Gros notified and orders to extubate the patient. Will inform RT and proceed with family's wishes.

## 2018-04-20 DEATH — deceased

## 2019-08-16 IMAGING — DX DG CHEST 1V PORT
1 series · 2 of 2 positions shown · non-contrast
Comparison: None.

CLINICAL DATA: Fall.  Patient intubated.

EXAM:
PORTABLE CHEST 1 VIEW

[Series 1: chest · 0.14mm/px · 2 of 2 slices shown]
[im 1/2]
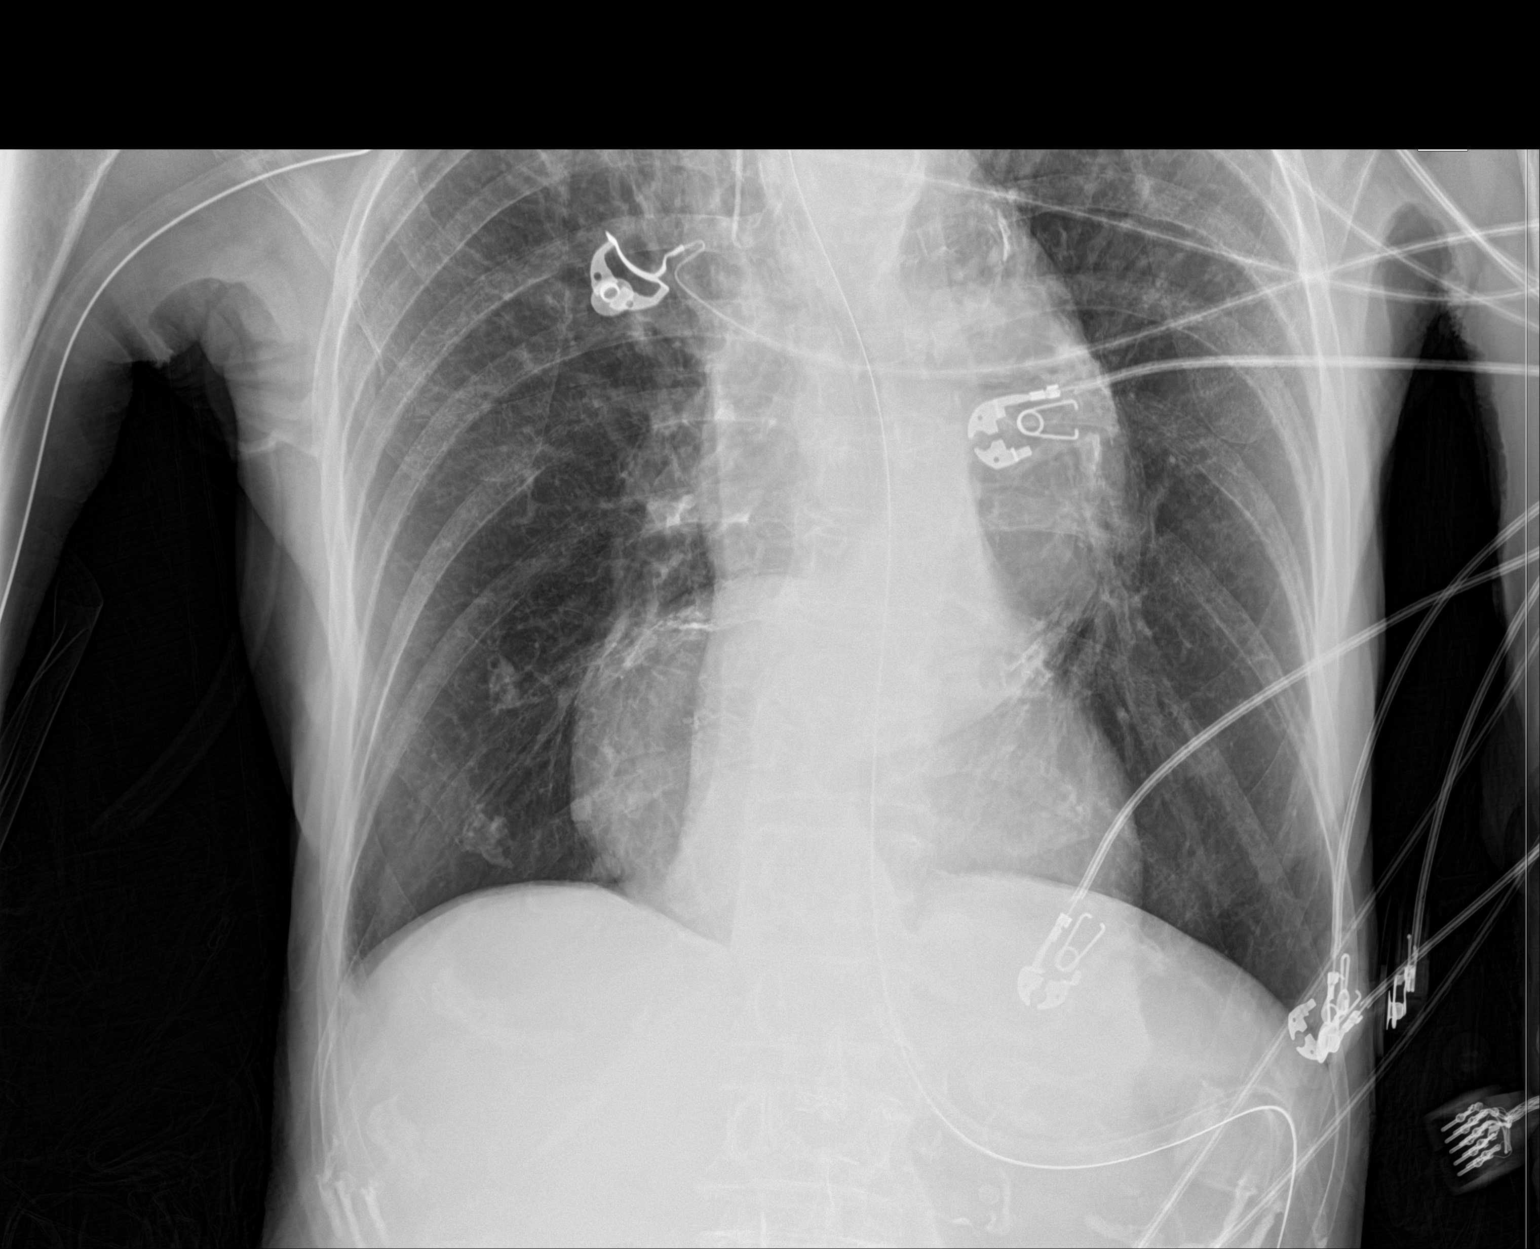
[im 2/2]
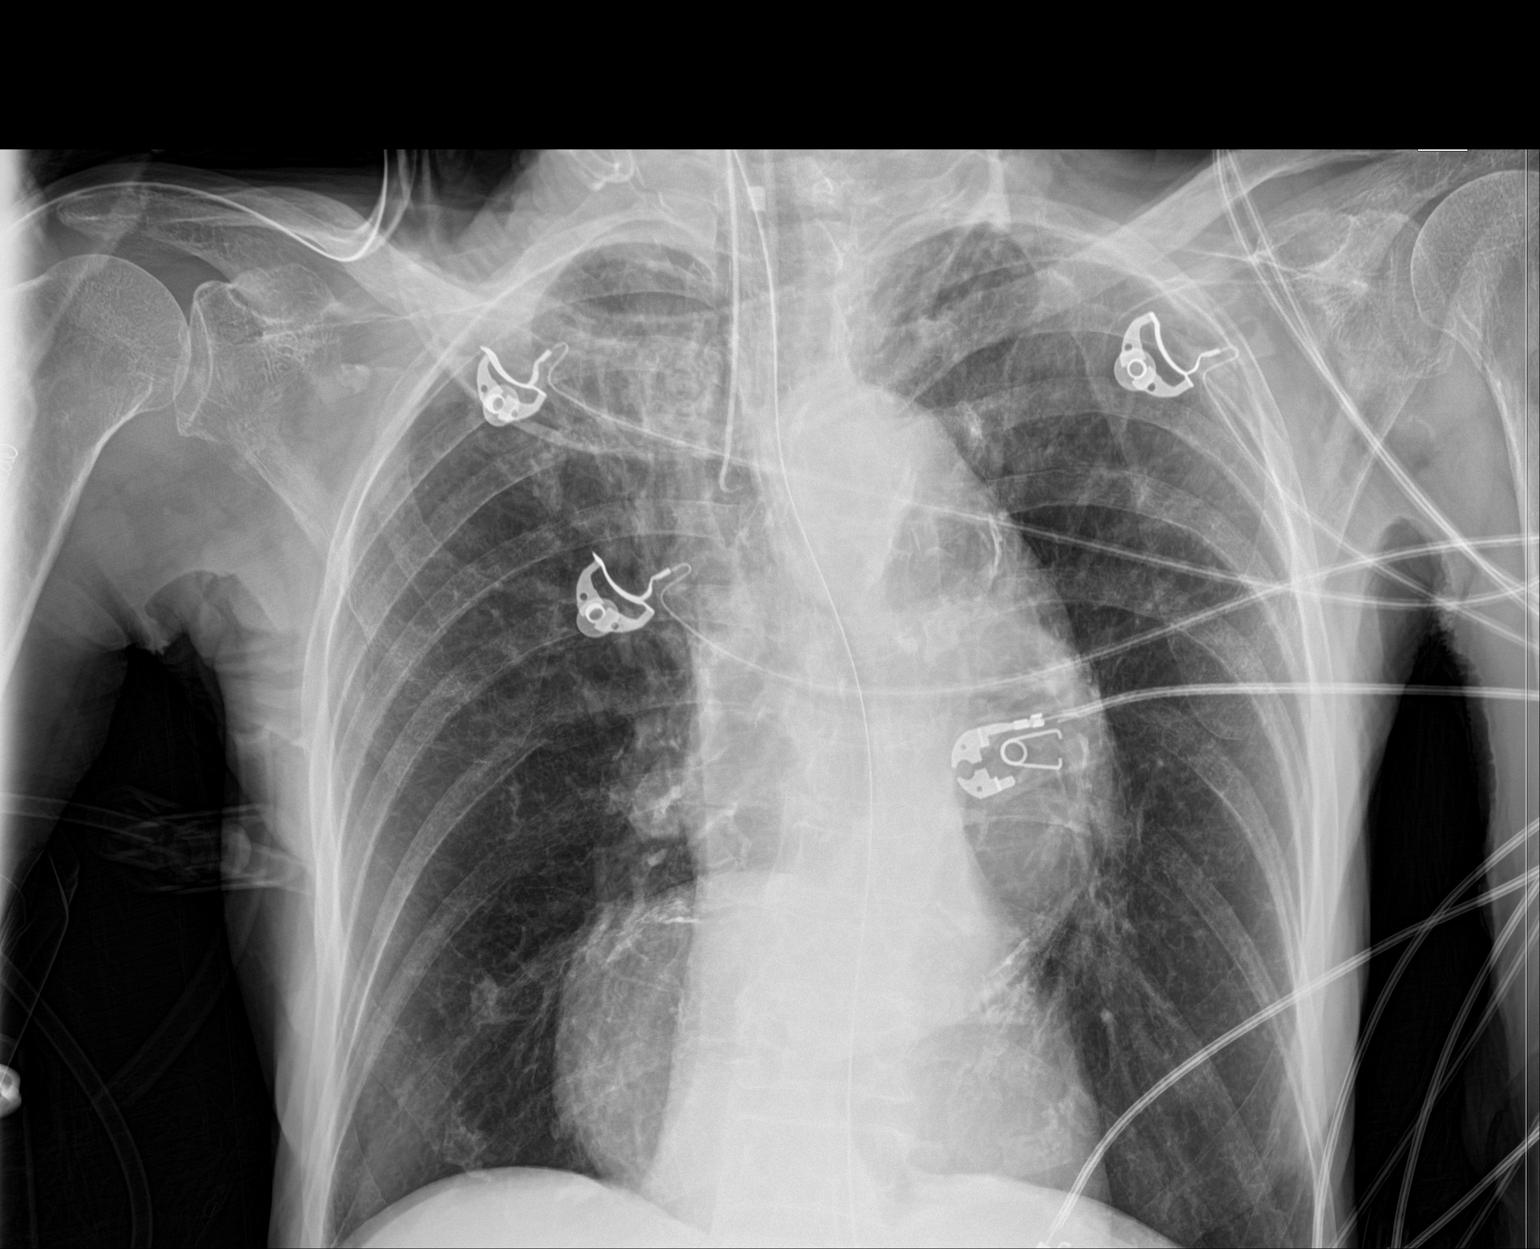

[2 of 2 positions shown; findings below may reference images not displayed]

FINDINGS: Cardiomegaly with tortuous and probable aneurysmal dilatation of the
thoracic aorta noted.

An endotracheal tube is identified with tip 3 cm above the carina.

An NG tube entering stomach.

There is no evidence of focal airspace disease, pulmonary edema,
suspicious pulmonary nodule/mass, pleural effusion, or pneumothorax.

No acute bony abnormalities are identified.
IMPRESSION: Cardiomegaly with tortuous/probable aneurysmal thoracic aorta. No
acute abnormality otherwise.

Support apparatus as described.

## 2019-08-16 IMAGING — CT CT ABD-PELV W/ CM
2 of 5 series · 14 of 46 positions shown, 16 images · IV contrast (iopamidol)
Comparison: Head and cervical spine CT today reported separately.

CLINICAL DATA: 89-year-old female status post fall today with hip
fracture.

EXAM:
CT CHEST, ABDOMEN, AND PELVIS WITH CONTRAST
TECHNIQUE: Multidetector CT imaging of the chest, abdomen and pelvis was
performed following the standard protocol during bolus
administration of intravenous contrast.
CONTRAST:  100mL [id number] IOPAMIDOL ([id number]) INJECTION 61%

[Series 6: cap with · axial · 0.56mm/px · z∈[-717,-217]mm · 11 of 121 slices shown, 13 images]
[im 11/121  soft-tissue]
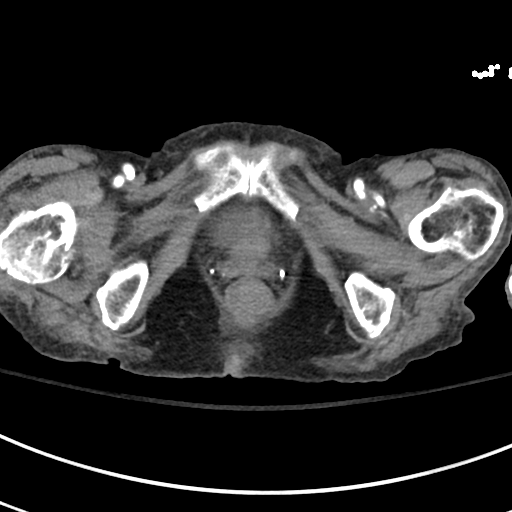
[im 11/121  bone]
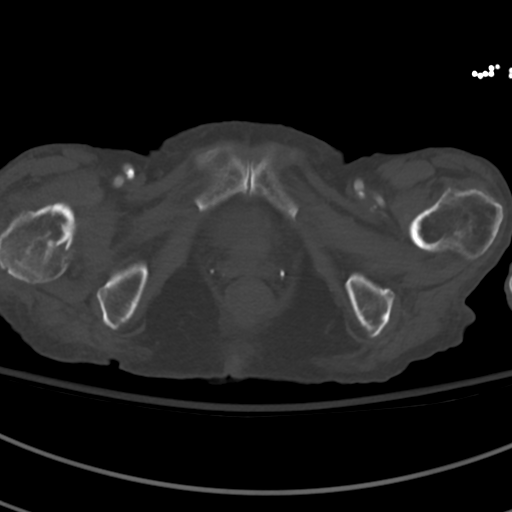
[im 21/121  soft-tissue]
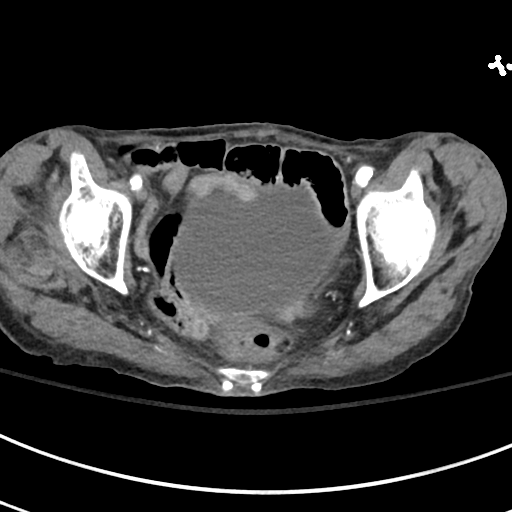
[im 31/121  soft-tissue]
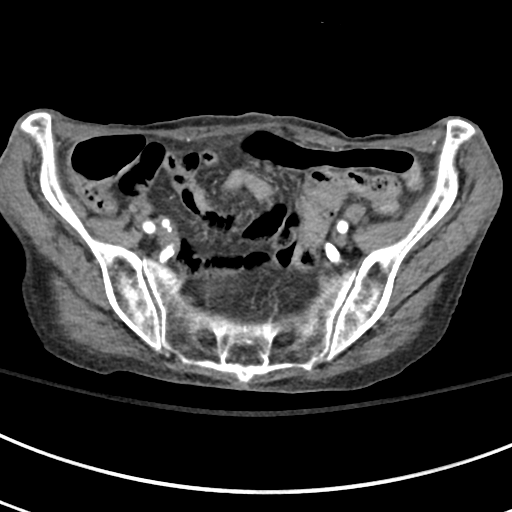
[im 41/121  soft-tissue]
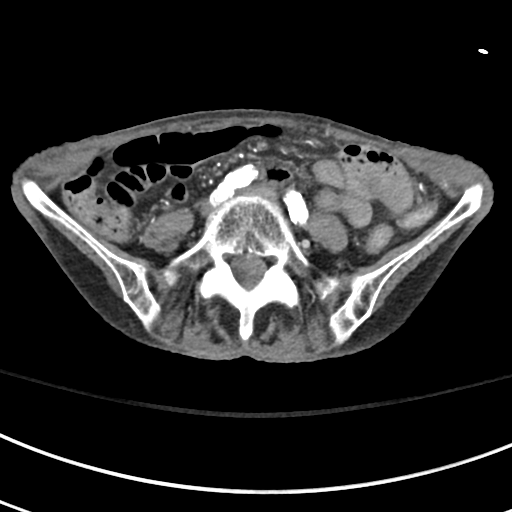
[im 51/121  soft-tissue]
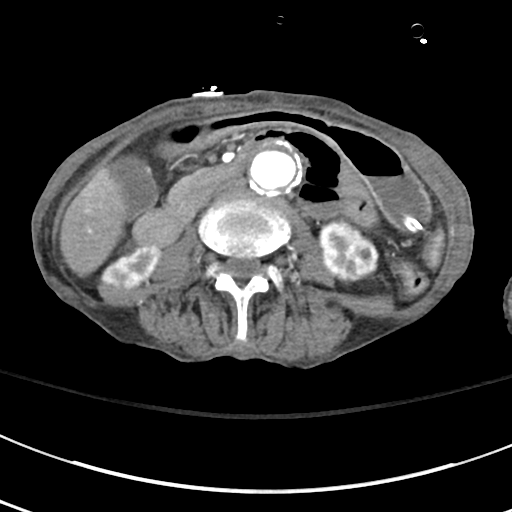
[im 61/121  soft-tissue]
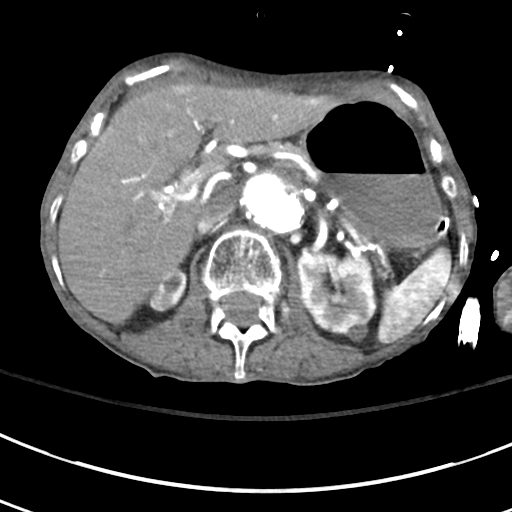
[im 71/121  soft-tissue]
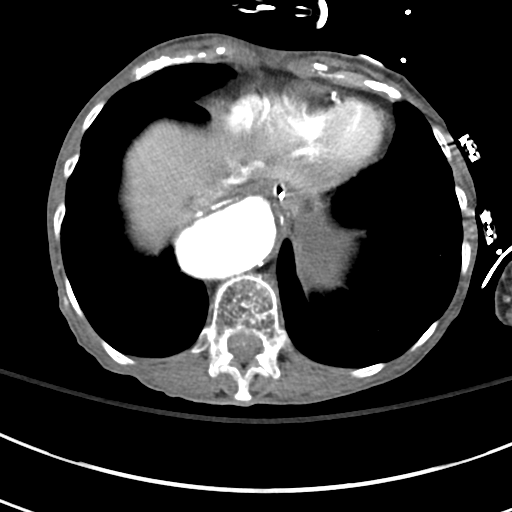
[im 81/121  soft-tissue]
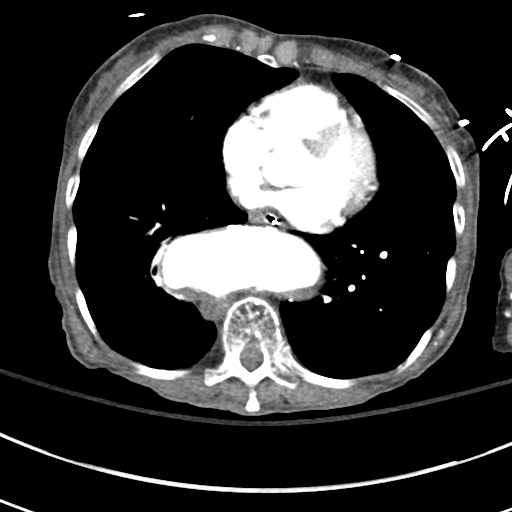
[im 91/121  soft-tissue]
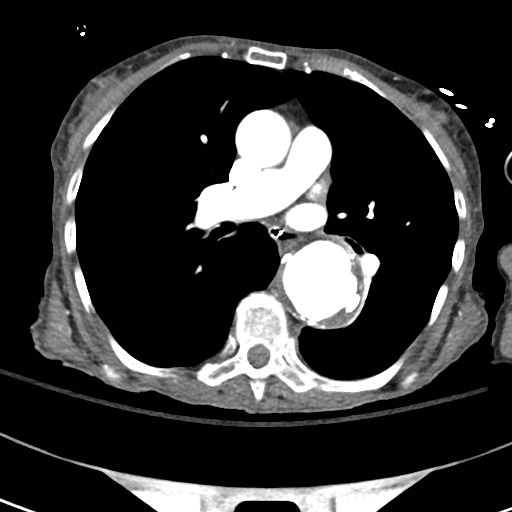
[im 91/121  bone]
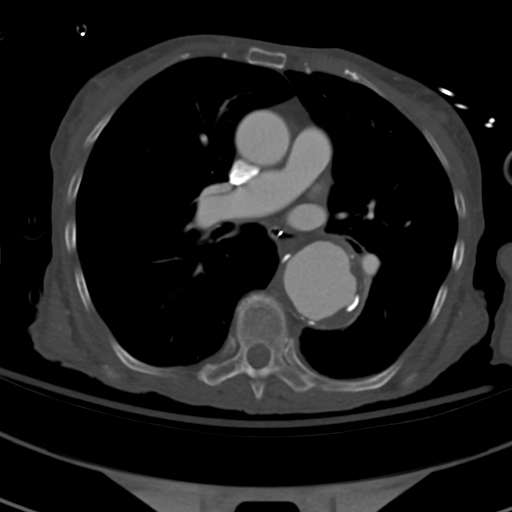
[im 101/121  soft-tissue]
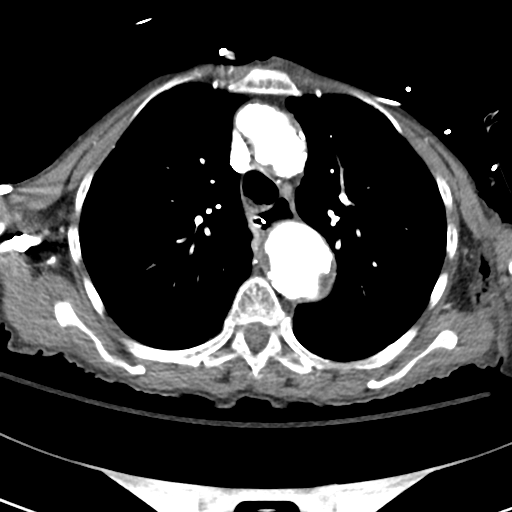
[im 111/121  soft-tissue]
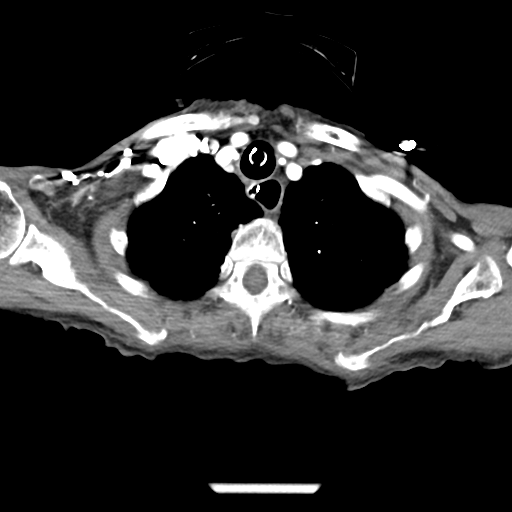

[Series 9: cor · coronal · 0.76mm/px · 3 of 68 slices shown]
[im 23/68  soft-tissue]
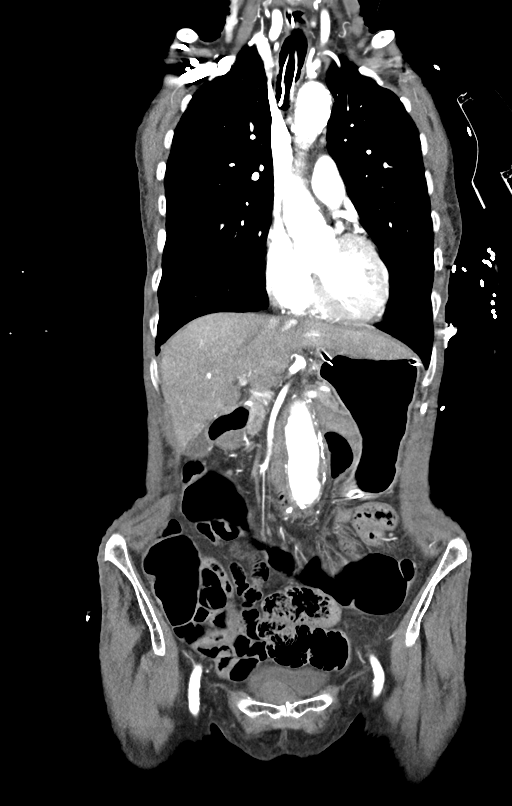
[im 30/68  soft-tissue]
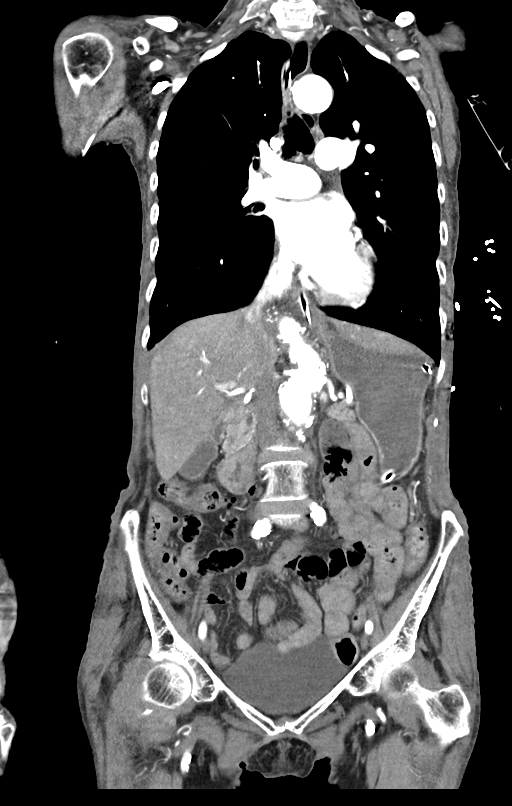
[im 38/68  soft-tissue]
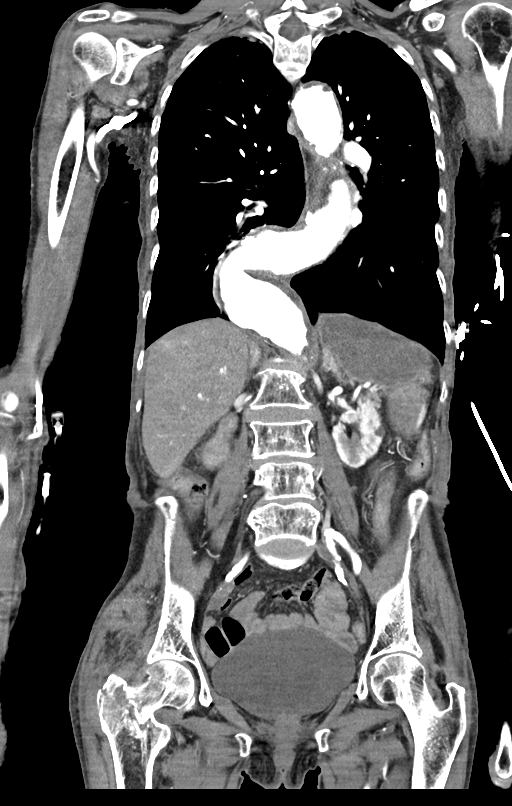

[14 of 46 positions shown; findings below may reference images not displayed]

FINDINGS: CT CHEST FINDINGS

Cardiovascular: Fusiform aneurysmal descending thoracic aorta with
widespread atherosclerosis. The aorta measures up to 49-51
millimeters diameter from the distal arch to the diaphragmatic
hiatus. Mural plaque and/or thrombus. Occasional areas of ulcerated
plaque (12 millimeters in the medial descending thoracic aorta at
the level of the carina on series 6, image 28). No periaortic
hematoma. No thoracic aorta dissection.

No cardiomegaly. No pericardial effusion. Calcified coronary artery
atherosclerosis.

Mediastinum/Nodes: No mediastinal hematoma. No lymphadenopathy.
Enteric tube courses through the esophagus into the stomach.

Lungs/Pleura: Endotracheal tube in place, tip in good position
between the clavicles and carina. Small volume of retained
secretions at the carina and in the right mainstem bronchus. Large
lung volumes with centrilobular emphysema. No pneumothorax, pleural
effusion, pulmonary contusion.

Musculoskeletal: Mild to moderate T11 vertebral body compression
fracture has a chronic appearance. No significant retropulsion of
bone. The other thoracic vertebrae appear intact. Intact sternum.
Chronic right anterolateral 3rd rib fracture. No definite acute rib
fracture. No superficial chest wall soft tissue injury.

CT ABDOMEN PELVIS FINDINGS

Hepatobiliary: No liver injury.  Negative gallbladder.

Pancreas: Negative.

Spleen: Intact and negative.

Adrenals/Urinary Tract: Right renal atrophy. Benign appearing
bilateral renal cysts. Symmetric bilateral renal enhancement and
contrast excretion. Adrenal glands appear intact.

Stomach/Bowel: No dilated large or small bowel. Enteric tube courses
to the distal gastric body. The stomach is mildly distended with air
and fluid.

No abdominal free air.  No free fluid identified.

Vascular/Lymphatic: Fusiform aneurysmal infrarenal abdominal aorta
throughout that segment measuring up to 34 millimeters diameter.
Bulky atherosclerosis. Patent main right renal artery stent. The
major vascular structures in the abdomen and pelvis remain patent.
Portal venous system is patent on the delayed images. No
lymphadenopathy.

Reproductive: Diminutive or absent.

Other: No pelvic free fluid.

Musculoskeletal: Osteopenia. Lumbar spine, sacrum, and pelvis appear
intact. Intact proximal left femur.

Proximal right femur intertrochanteric fracture (coronal images 38
and 39) is comminuted but only minimally displaced.

No superficial soft tissue injury identified.
IMPRESSION: 1. Positive for comminuted Intertrochanteric Right Femur Fracture.
Fracture fragments are minimally displaced. Osteopenia.
2. No other acute traumatic injury identified in the chest, abdomen,
or pelvis.
Chronic appearing mild T11 compression fracture and right 3rd rib
fracture.
3. Widespread fusiform Aortic aneurysm (DMTIC-OAI.Q) from the
descending through the infrarenal segments, up to 51 mm diameter.
Bulky superimposed Aortic Atherosclerosis (DMTIC-QJM.M) with
occasional ulcerated plaques. No aortic dissection or rupture.
4.  Emphysema (DMTIC-SWJ.O).
5. This study was reviewed in person with trauma surgery Dr. Areen
at [id number] hours.
